# Patient Record
Sex: Female | Born: 2000 | Race: White | Hispanic: No | Marital: Single | Smoking: Never smoker
Health system: Southern US, Community
[De-identification: ages and names within clinical notes are randomized; demographics above are authoritative.]

## PROBLEM LIST (undated history)

## (undated) DIAGNOSIS — Z5189 Encounter for other specified aftercare: Secondary | ICD-10-CM

## (undated) DIAGNOSIS — G43909 Migraine, unspecified, not intractable, without status migrainosus: Secondary | ICD-10-CM

## (undated) DIAGNOSIS — F4310 Post-traumatic stress disorder, unspecified: Secondary | ICD-10-CM

## (undated) DIAGNOSIS — R000 Tachycardia, unspecified: Secondary | ICD-10-CM

## (undated) DIAGNOSIS — F419 Anxiety disorder, unspecified: Secondary | ICD-10-CM

## (undated) DIAGNOSIS — F32A Depression, unspecified: Secondary | ICD-10-CM

## (undated) DIAGNOSIS — F410 Panic disorder [episodic paroxysmal anxiety] without agoraphobia: Secondary | ICD-10-CM

## (undated) HISTORY — DX: Anxiety disorder, unspecified: F41.9

## (undated) HISTORY — DX: Tachycardia, unspecified: R00.0

## (undated) HISTORY — DX: Post-traumatic stress disorder, unspecified: F43.10

## (undated) HISTORY — PX: NO PAST SURGERIES: SHX2092

## (undated) HISTORY — DX: Depression, unspecified: F32.A

---
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
## 2020-01-08 ENCOUNTER — Other Ambulatory Visit (INDEPENDENT_AMBULATORY_CARE_PROVIDER_SITE_OTHER): Payer: Self-pay | Admitting: Pediatrics

## 2020-01-09 ENCOUNTER — Other Ambulatory Visit: Payer: Self-pay

## 2020-01-09 MED ORDER — DULOXETINE HCL 20 MG PO CPEP: 20.0000 mg | ORAL_CAPSULE | Freq: Every day | ORAL | 3 refills | Status: DC

## 2020-01-12 ENCOUNTER — Ambulatory Visit (INDEPENDENT_AMBULATORY_CARE_PROVIDER_SITE_OTHER): Payer: 59 | Admitting: Cardiology

## 2020-01-12 ENCOUNTER — Encounter: Payer: Self-pay | Admitting: Cardiology

## 2020-01-12 ENCOUNTER — Other Ambulatory Visit: Payer: Self-pay

## 2020-01-12 VITALS — BP 110/78 | HR 88 | Ht 61.01 in | Wt 97.0 lb

## 2020-01-12 DIAGNOSIS — R002 Palpitations: Secondary | ICD-10-CM

## 2020-01-12 DIAGNOSIS — I471 Supraventricular tachycardia: Secondary | ICD-10-CM

## 2020-01-12 NOTE — Patient Instructions (Signed)
Medication Instructions:  No medication changes *If you need a refill on your cardiac medications before your next appointment, please call your pharmacy*   Lab Work: Your physician recommends that you have a BMET and TSH drawn today.  If you have labs (blood work) drawn today and your tests are completely normal, you will receive your results only by: Marland Kitchen MyChart Message (if you have MyChart) OR . A paper copy in the mail If you have any lab test that is abnormal or we need to change your treatment, we will call you to review the results.   Testing/Procedures: None ordered   Follow-Up: At [facility], you and your health needs are our priority.  As part of our continuing mission to provide you with exceptional heart care, we have created designated Provider Care Teams.  These Care Teams include your primary Cardiologist (physician) and Advanced Practice Providers (APPs -  Physician Assistants and Nurse Practitioners) who all work together to provide you with the care you need, when you need it.  We recommend signing up for the patient portal called "MyChart".  Sign up information is provided on this After Visit Summary.  MyChart is used to connect with patients for Virtual Visits (Telemedicine).  Patients are able to view lab/test results, encounter notes, upcoming appointments, etc.  Non-urgent messages can be sent to your provider as well.   To learn more about what you can do with MyChart, go to [email or web address].    Your next appointment:   6 month(s)  The format for your next appointment:   In Person  Provider:   Belva Crome, MD   Other Instructions NA

## 2020-01-12 NOTE — Addendum Note (Signed)
Addended by: Eleonore Chiquito on: 01/12/2020 10:47 AM   Modules accepted: Orders

## 2020-01-12 NOTE — Progress Notes (Signed)
Cardiology Office Note:    Date:  01/12/2020   ID:  Deborah Mcdaniel, DOB July 01, 2001, MRN [id number]  PCP:  Deborah Ponto, MD  Cardiologist:  Deborah Brothers, MD   Referring MD: Deborah Ponto, MD    ASSESSMENT:    1. Paroxysmal SVT (supraventricular tachycardia) (HCC)   2. Palpitations    PLAN:    In order of problems listed above:  1. Primary prevention stressed with the patient.  Importance of compliance with diet and medication stressed her effort tolerance is excellent. 2. Palpitations: Supraventricular tachycardia paroxysms: Her palpitations have increased in frequency.  She is not keen on being on medications for a long time.  I had offered her the option of ablation in the past but she had deferred it because of her college.  Now she is back from college and is to be evaluated for this.  We will do a Chem-7 and a TSH today and refer her to our partner Deborah Mcdaniel for the needful. 3. Patient will be seen in follow-up appointment in 6 months or earlier if the patient has any concerns    Medication Adjustments/Labs and Tests Ordered: Current medicines are reviewed at length with the patient today.  Concerns regarding medicines are outlined above.  No orders of the defined types were placed in this encounter.  No orders of the defined types were placed in this encounter.    Chief Complaint  Patient presents with  . Follow-up     History of Present Illness:    Deborah Mcdaniel is a 19 y.o. female.  Patient has past medical history of supraventricular tachycardia and palpitations.  She is taking flecainide and tolerating it well.  She mentions to me that she is back from college now and is at home.  In the past 2 to 3 weeks her palpitations have gotten a little worse.  She walks about 45 minutes a day on a very regular basis and is taking very good care of herself.  No history of syncope or any dizziness.  At the time of my evaluation, the patient is alert awake oriented and in  no distress.  She now wants to be evaluated for ablation especially because it is her vacation time.  Past Medical History:  Diagnosis Date  . Cardiac murmur 07/29/2019  . Chest pain of uncertain etiology 10/06/2019  . Chronic tension-type headache, not intractable 09/03/2017  . Migraines   . Muscle cramp 11/12/2017  . Palpitations 07/29/2019  . Panic attacks   . Paroxysmal SVT (supraventricular tachycardia) (HCC) 09/08/2019  . Sciatica of left side 09/03/2017  . Sciatica of right side 11/12/2017  . Tachycardia   . Transient alteration of awareness 11/08/2017    Past Surgical History:  Procedure Laterality Date  . NO PAST SURGERIES      Current Medications: Current Meds  Medication Sig  . busPIRone (BUSPAR) 15 MG tablet Take 15 mg by mouth daily.   . DULoxetine (CYMBALTA) 20 MG capsule Take 1 capsule (20 mg total) by mouth daily.  . flecainide (TAMBOCOR) 50 MG tablet Take 1 tablet (50 mg total) by mouth 2 (two) times daily.  Marland Kitchen guanFACINE (TENEX) 1 MG tablet Take 1 mg by mouth at bedtime.  . mirtazapine (REMERON) 30 MG tablet Take 30 mg by mouth at bedtime.   . Multiple Vitamin (MULTIVITAMIN) tablet Take 1 tablet by mouth daily. Patient takes vit d and potassium  . SUMAtriptan (IMITREX) 20 MG/ACT nasal spray Place 1 spray (20 mg  total) into the nose as needed for migraine or headache (take once at onset of headache.  May repeat in 2 hours x1.).     Allergies:   Patient has no known allergies.   Social History   Socioeconomic History  . Marital status: Single    Spouse name: Not on file  . Number of children: Not on file  . Years of education: Not on file  . Highest education level: Not on file  Occupational History  . Not on file  Tobacco Use  . Smoking status: Never Smoker  . Smokeless tobacco: Never Used  Substance and Sexual Activity  . Alcohol use: No  . Drug use: No  . Sexual activity: Not on file  Other Topics Concern  . 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 She lives with her mother. 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       Counseling at [employer]- once a month   Social Determinants of Health   Financial Resource Strain:   . Difficulty of Paying Living Expenses:   Food Insecurity:   . Worried About Charity fundraiser in the Last Year:   . Arboriculturist in the Last Year:   Transportation Needs:   . Film/video editor (Medical):   Marland Kitchen Lack of Transportation (Non-Medical):   Physical Activity:   . Days of Exercise per Week:   . Minutes of Exercise per Session:   Stress:   . Feeling of Stress :   Social Connections:   . Frequency of Communication with Friends and Family:   . Frequency of Social Gatherings with Friends and Family:   . Attends Religious Services:   . Active Member of Clubs or Organizations:   . Attends Archivist Meetings:   Marland Kitchen Marital Status:      Family History: The patient's family history includes ADD / ADHD in her cousin and mother; Anxiety disorder in her father and mother; Depression in her father and mother; Migraines in her mother. There is no history of Seizures, Bipolar disorder, Schizophrenia, or Autism.  ROS:   Please see the history of present illness.    All other systems reviewed and are negative.  EKGs/Labs/Other Studies Reviewed:    The following studies were reviewed today: I discussed my findings with the patient at length   Recent Labs: No results found for requested labs within last 8760 hours.  Recent Lipid Panel No results found for: CHOL, TRIG, HDL, CHOLHDL, VLDL, LDLCALC, LDLDIRECT  Physical Exam:    VS:  BP 110/78 (BP Location: Left Arm, Patient Position: Sitting, Cuff Size: Normal)   Pulse 88   Ht 5' 1.01" (1.55 m)   Wt 97 lb (44 kg)   SpO2 99%   BMI 18.32 kg/m     Wt Readings from Last 3 Encounters:  01/12/20 97 lb (44 kg) (2 %, Z= -2.05)*    09/23/19 93 lb (42.2 kg) (<1 %, Z= -2.45)*  09/12/19 92 lb 9.6 oz (42 kg) (<1 %, Z= -2.49)*   * Growth percentiles are based on CDC (Girls, 2-20 Years) data.     GEN: Patient is in no acute distress HEENT: Normal NECK: No JVD; No carotid bruits LYMPHATICS: No lymphadenopathy CARDIAC: Hear sounds regular, 2/6 systolic murmur at the apex. RESPIRATORY:  Clear to auscultation without rales, wheezing  or rhonchi  ABDOMEN: Soft, non-tender, non-distended MUSCULOSKELETAL:  No edema; No deformity  SKIN: Warm and dry NEUROLOGIC:  Alert and oriented x 3 PSYCHIATRIC:  Normal affect   Signed, Deborah Brothers, MD  01/12/2020 10:30 AM    [facility]

## 2020-01-13 ENCOUNTER — Telehealth: Payer: Self-pay

## 2020-01-13 LAB — BASIC METABOLIC PANEL
BUN/Creatinine Ratio: 12 (ref 9–23)
BUN: 11 mg/dL (ref 6–20)
CO2: 25 mmol/L (ref 20–29)
Calcium: 9.5 mg/dL (ref 8.7–10.2)
Chloride: 100 mmol/L (ref 96–106)
Creatinine, Ser: 0.89 mg/dL (ref 0.57–1.00)
GFR calc Af Amer: 109 mL/min/{1.73_m2} (ref >59)
GFR calc non Af Amer: 94 mL/min/{1.73_m2} (ref >59)
Glucose: 60 mg/dL — ABNORMAL LOW (ref 65–99)
Potassium: 4.3 mmol/L (ref 3.5–5.2)
Sodium: 140 mmol/L (ref 134–144)

## 2020-01-13 LAB — TSH: TSH: 1.1 u[IU]/mL (ref 0.450–4.500)

## 2020-01-13 NOTE — Telephone Encounter (Signed)
-----   Message from Garwin Brothers, MD sent at 01/13/2020  8:10 AM EDT ----- The results of the study is unremarkable. Please inform patient. I will discuss in detail at next appointment. Cc  primary care/referring physician Garwin Brothers, MD 01/13/2020 8:10 AM

## 2020-01-13 NOTE — Telephone Encounter (Signed)
Spoke with patient regarding results and recommendation.  Patient verbalizes understanding and is agreeable to plan of care. Advised patient to call back with any issues or concerns.  

## 2020-01-13 NOTE — Telephone Encounter (Signed)
Left message on patients voicemail to please return our call.   

## 2020-01-13 NOTE — Telephone Encounter (Signed)
Follow up ° ° °Patient is returning call for lab results. Please call. °

## 2020-01-13 NOTE — Telephone Encounter (Signed)
-----   Message from Rajan R Revankar, MD sent at 01/13/2020  8:10 AM EDT ----- The results of the study is unremarkable. Please inform patient. I will discuss in detail at next appointment. Cc  primary care/referring physician Rajan R Revankar, MD 01/13/2020 8:10 AM  

## 2020-02-04 ENCOUNTER — Ambulatory Visit (INDEPENDENT_AMBULATORY_CARE_PROVIDER_SITE_OTHER): Payer: 59 | Admitting: Internal Medicine

## 2020-02-04 ENCOUNTER — Encounter: Payer: Self-pay | Admitting: Internal Medicine

## 2020-02-04 ENCOUNTER — Other Ambulatory Visit: Payer: Self-pay

## 2020-02-04 VITALS — BP 102/64 | HR 91 | Ht 61.0 in | Wt 94.8 lb

## 2020-02-04 DIAGNOSIS — R002 Palpitations: Secondary | ICD-10-CM

## 2020-02-04 DIAGNOSIS — R000 Tachycardia, unspecified: Secondary | ICD-10-CM

## 2020-02-04 NOTE — Patient Instructions (Addendum)
Medication Instructions:  Your physician recommends that you continue on your current medications as directed. Please refer to the Current Medication list given to you today.  Labwork: None ordered.  Testing/Procedures: None ordered.  Follow-Up: Your physician wants you to follow-up in: December 2021 with Dr. Ladona Ridgel.     When you know what days you will be on break call:  Harrietta Guardian at [phone]   Any Other Special Instructions Will Be Listed Below (If Applicable).  If you need a refill on your cardiac medications before your next appointment, please call your pharmacy.

## 2020-02-04 NOTE — Progress Notes (Signed)
HPI Deborah Mcdaniel is referred today by Dr. Tomie China for evaluation of palpitations. She is a pleasant 19 yo woman with a lot of cardiac awareness. She has seen Dr. Josiah Lobo for palpitations on several occaisions and has been started first on metoprolol and then on flecainide. Her cardiac monitor showed NSR and sinus tachycardia but also some non-sustained junctional tachycardia. I do not see any clear cut SVT. She has not had syncope. At times it feels like her heart is racing, despite her not having other symptoms. She thinks that the flecainide has helped symptoms.  No Known Allergies   Current Outpatient Medications  Medication Sig Dispense Refill  . busPIRone (BUSPAR) 15 MG tablet Take 15 mg by mouth daily.     . DULoxetine (CYMBALTA) 20 MG capsule Take 1 capsule (20 mg total) by mouth daily. 30 capsule 3  . flecainide (TAMBOCOR) 50 MG tablet Take 1 tablet (50 mg total) by mouth 2 (two) times daily. 60 tablet 3  . guanFACINE (TENEX) 1 MG tablet Take 1 mg by mouth at bedtime.    . mirtazapine (REMERON) 30 MG tablet Take 30 mg by mouth at bedtime.     . Multiple Vitamin (MULTIVITAMIN) tablet Take 1 tablet by mouth daily. Patient takes vit d and potassium    . SUMAtriptan (IMITREX) 20 MG/ACT nasal spray Place 1 spray (20 mg total) into the nose as needed for migraine or headache (take once at onset of headache.  May repeat in 2 hours x1.). 12 Inhaler 5   No current facility-administered medications for this visit.     Past Medical History:  Diagnosis Date  . Cardiac murmur 07/29/2019  . Chest pain of uncertain etiology 10/06/2019  . Chronic tension-type headache, not intractable 09/03/2017  . Migraines   . Muscle cramp 11/12/2017  . Palpitations 07/29/2019  . Panic attacks   . Paroxysmal SVT (supraventricular tachycardia) (HCC) 09/08/2019  . Sciatica of left side 09/03/2017  . Sciatica of right side 11/12/2017  . Tachycardia   . Transient alteration of awareness 11/08/2017    ROS:   All systems reviewed and negative except as noted in the HPI.   Past Surgical History:  Procedure Laterality Date  . NO PAST SURGERIES       Family History  Problem Relation Age of Onset  . Migraines Mother   . Depression Mother   . Anxiety disorder Mother   . ADD / ADHD Mother   . Depression Father   . Anxiety disorder Father   . ADD / ADHD Cousin   . Seizures Neg Hx   . Bipolar disorder Neg Hx   . Schizophrenia Neg Hx   . Autism Neg Hx      Social History   Socioeconomic History  . Marital status: Single    Spouse name: Not on file  . Number of children: Not on file  . Years of education: Not on file  . Highest education level: Not on file  Occupational History  . Not on file  Tobacco Use  . Smoking status: Never Smoker  . Smokeless tobacco: Never Used  Substance and Sexual Activity  . Alcohol use: No  . Drug use: No  . Sexual activity: Not on file  Other Topics Concern  . 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 She lives with her mother. 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       Counseling at [employer]- once a month   Social Determinants of Health   Financial Resource Strain:   . Difficulty of Paying Living Expenses:   Food Insecurity:   . Worried About Charity fundraiser in the Last Year:   . Arboriculturist in the Last Year:   Transportation Needs:   . Film/video editor (Medical):   Marland Kitchen Lack of Transportation (Non-Medical):   Physical Activity:   . Days of Exercise per Week:   . Minutes of Exercise per Session:   Stress:   . Feeling of Stress :   Social Connections:   . Frequency of Communication with Friends and Family:   . Frequency of Social Gatherings with Friends and Family:   . Attends Religious Services:   . Active Member of Clubs or Organizations:   . Attends Archivist Meetings:   Marland Kitchen Marital Status:     Intimate Partner Violence:   . Fear of Current or Ex-Partner:   . Emotionally Abused:   Marland Kitchen Physically Abused:   . Sexually Abused:      BP 102/64   Pulse 91   Ht 5\' 1"  (1.549 m)   Wt 94 lb 12.8 oz (43 kg)   SpO2 100%   BMI 17.91 kg/m   Physical Exam:  Well appearing NAD HEENT: Unremarkable Neck:  No JVD, no thyromegally Lymphatics:  No adenopathy Back:  No CVA tenderness Lungs:  Clear with no wheezes HEART:  Regular rate rhythm, no murmurs, no rubs, no clicks Abd:  soft, positive bowel sounds, no organomegally, no rebound, no guarding Ext:  2 plus pulses, no edema, no cyanosis, no clubbing Skin:  No rashes no nodules Neuro:  CN II through XII intact, motor grossly intact  EKG - NSR  Assess/Plan: 1. Palpitations - the etiology appears to be sinus tachycardia. I do not see any clear evidence of SVT. However, she clearly has experienced an improvement on the low dose flecainide so I will allow her to continue this for now.  2. Probable autonomic dysfunction - I think that much of her symptoms are related to autonomic dysfunction. I have asked her to increase her salt and fluid intake, not miss meals, and increase her level of exercise. I'll see her back in 12/21.   Mikle Bosworth.D.

## 2020-02-09 ENCOUNTER — Other Ambulatory Visit: Payer: Self-pay | Admitting: Cardiology

## 2020-02-11 ENCOUNTER — Encounter (INDEPENDENT_AMBULATORY_CARE_PROVIDER_SITE_OTHER): Payer: Self-pay | Admitting: Pediatrics

## 2020-02-11 ENCOUNTER — Telehealth (INDEPENDENT_AMBULATORY_CARE_PROVIDER_SITE_OTHER): Payer: Self-pay | Admitting: Pediatrics

## 2020-02-11 NOTE — Telephone Encounter (Signed)
Left a message to call back and schedule follow up. A letter was also sent to do this

## 2020-03-01 ENCOUNTER — Telehealth (INDEPENDENT_AMBULATORY_CARE_PROVIDER_SITE_OTHER): Payer: Self-pay | Admitting: Pediatrics

## 2020-03-01 NOTE — Telephone Encounter (Signed)
Please schedule this pt to See Dr Artis Flock in the next couple weeks.  Thanks

## 2020-03-01 NOTE — Telephone Encounter (Signed)
Who's calling (name and relationship to patient) :Deborah Mcdaniel ( Self)   Best contact number:[phone]  Provider they see:Dr. Artis Flock  Reason for call:Caler States she is having shooting pain at the top of head Spoke with on call Provider dr. Devonne Doughty at 7:56 pm 02-27-20  Call ID: [phone]     PRESCRIPTION REFILL ONLY  Name of prescription:  Pharmacy:

## 2020-03-03 NOTE — Telephone Encounter (Signed)
Patient has been scheduled to come in to see Dr. Artis Flock on 03-29-20

## 2020-03-09 NOTE — Progress Notes (Signed)
[facility]    Provider:  Dr Lucia Gaskins Requesting Provider: Marylen Ponto, MD Primary Care Provider:  Marylen Ponto, MD  CC:  Pinched nerve  HPI:  Deborah Mcdaniel is a 19 y.o. female here as requested by Marylen Ponto, MD for "pinched nerve".  She has a past medical history of sciatica, chronic tension type headache, anxiety, asthma, depression, migraine, PTSD.Patient was recently seen in the emergency room February 28, 2020 with complaints of headache onset 2 to 3 days prior, located at the very top of her head but radiating down the entire right side of her body to her toes.  This is intermittent pain, mild and achy at times but then she will have stabbing pain that radiates all the way down to her toes.  The pain is 3 out of 10 and can be 10 out of 10 at its worst.  She also reported a history of migraines, tried taking Imitrex but it did not work, reported associated nausea.  She also reported she lost consciousness for less than a minute and fell to the floor.  She denied vomiting, visual disturbances, photophobia, weakness, numbness, tingling, chest pain, shortness of breath, seizure, palpitations or other associated symptoms.  Examination was normal and she was in no acute distress.  Labs included a BMP with BUN of 11 and creatinine 0.76 which is normal.  CT of the head showed no acute intracranial abnormality, normal brain (reviewed report).  Potassium was slightly low, she was reassured and she felt better, she was given a migraine cocktail, and discharged.  She reports today she has a "pinched nerve". She was in the ED for migraines recently. She has shooting pain down the right side from the neck to the fingers, and goes down the leg the same thing that happened din the ED in the setting of a headache. It was her face, the arm and the leg in a setting of a headache. She has had sciatica in the past and this was different. She has had migraine since she was little, she went to the  ED and had stabbing pain on top of the head, migraines are usually in the back of the head or in the temples, she gets nauseated and pulsating and pounding throbbing, light and sound sensitivity, she has a family history of migraines, face was involved that day. She has had the shooting pain in the arm and the leg the arm goes numb. Both hands usually go numb together not always with the leg, the leg shooting pain is more like her sciatica. She gets headaches once a week, she takes imitrex and ibuprofen. Sleeping helps. Here with boyfriend who also provides information. She has palpitations and has seen crdiology. No other focal neurologic deficits, associated symptoms, inciting events or modifiable factors.  Reviewed notes, labs and imaging from outside physicians, which showed:  CT head (reviewed report) normal 02/28/2020:  Brain: No evidence of acute infarction, hemorrhage, hydrocephalus,  extra-axial collection or mass lesion/mass effect.   Vascular: No hyperdense vessel or unexpected calcification.   Skull: Normal. Negative for fracture or focal lesion.   Sinuses/Orbits: No acute finding.   Other: None.   IMPRESSION:  1. No acute intracranial abnormalities. The appearance of the brain is normal.   02/28/2020: urine drug screen neg, ethanol negative, bmo with K 3., BUN 11, Creat 0.76 otherwise normal, cbc normal, lfts normal,  reviewd images XR c-spine 2019 and agree with the following: EXAM: CERVICAL SPINE - COMPLETE 4+  VIEW  COMPARISON:  None.  FINDINGS: There is no evidence of cervical spine fracture or prevertebral soft tissue swelling. Alignment is normal. No other significant bone abnormalities are identified.  IMPRESSION: Negative cervical spine radiographs.  Review of Systems: Patient complains of symptoms per HPI as well as the following symptoms: headaches, numbness, palpitations, scitica. Pertinent negatives and positives per HPI. All others negative.   Social  History   Socioeconomic History  . Marital status: Single    Spouse name: Not on file  . Number of children: Not on file  . Years of education: Not on file  . Highest education level: Not on file  Occupational History  . Not on file  Tobacco Use  . Smoking status: Never Smoker  . Smokeless tobacco: Never Used  Vaping Use  . Vaping Use: Never used  Substance and Sexual Activity  . Alcohol use: No  . Drug use: No  . Sexual activity: Not on file  Other Topics Concern  . Not on file  Social History Narrative   Deborah Mcdaniel is a sophomore at [employer]; she does well in school but misses a lot of school due to pain. She lives with her mother. 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       [facility]sel[facility] at [facility]- once a month (sees a nurse but doesn't do counseling as of 03/10/2020)      Right handed      Caffeine: none    Social Determinants of Health   Financial Resource Strain:   . Difficulty of Paying Living Expenses:   Food Insecurity:   . Worried About Programme researcher, broadcasting/film/videounning Out of Food in the Last Year:   . Baristaan Out of Food in the Last Year:   Transportation Needs:   . Freight forwarderLack of Transportation (Medical):   Marland Kitchen. Lack of Transportation (Non-Medical):   Physical Activity:   . Days of Exercise per Week:   . Minutes of Exercise per Session:   Stress:   . Feeling of Stress :   Social Connections:   . Frequency of Communication with Friends and Family:   . Frequency of Social Gatherings with Friends and Family:   . Attends Religious Services:   . Active Member of Clubs or Organizations:   . Attends BankerClub or Organization Meetings:   Marland Kitchen. Marital Status:   Intimate Partner Violence:   . Fear of Current or Ex-Partner:   . Emotionally Abused:   Marland Kitchen. Physically Abused:   . Sexually Abused:     Family History  Problem Relation Age of Onset  . Migraines Mother   . Depression Mother   . Anxiety disorder Mother   . ADD / ADHD Mother   . Fibromyalgia Mother   . Other Mother         myelomalacia  . Depression Father   . Anxiety disorder Father   . ADD / ADHD Cousin   . Seizures Neg Hx   . Bipolar disorder Neg Hx   . Schizophrenia Neg Hx   . Autism Neg Hx     Past Medical History:  Diagnosis Date  . Anxiety   . Cardiac murmur 07/29/2019  . Chest pain of uncertain etiology 10/06/2019  . Chronic tension-type headache, not intractable 09/03/2017  . Depression   . Migraines   . Muscle cramp 11/12/2017  . Palpitations 07/29/2019  . Panic attacks   . Paroxysmal SVT (supraventricular tachycardia) (HCC) 09/08/2019  . PTSD (post-traumatic stress disorder)   . Sciatica of left side  09/03/2017  . Sciatica of right side 11/12/2017  . Tachycardia   . Transient alteration of awareness 11/08/2017    Patient Active Problem List   Diagnosis Date Noted  . Bilateral hand numbness 03/10/2020  . Nonintractable episodic headache 03/10/2020  . Migraine without aura and without status migrainosus, not intractable 03/10/2020  . Tachycardia 02/04/2020  . Chest pain of uncertain etiology 10/06/2019  . Paroxysmal SVT (supraventricular tachycardia) (HCC) 09/08/2019  . Palpitations 07/29/2019  . Cardiac murmur 07/29/2019  . Sciatica of right side 11/12/2017  . Muscle cramp 11/12/2017  . Transient alteration of awareness 11/08/2017  . Sciatica of left side 09/03/2017  . Chronic tension-type headache, not intractable 09/03/2017    Past Surgical History:  Procedure Laterality Date  . NO PAST SURGERIES      Current Outpatient Medications  Medication Sig Dispense Refill  . busPIRone (BUSPAR) 15 MG tablet Take 15 mg by mouth daily.     Marland Kitchen guanFACINE (TENEX) 1 MG tablet Take 1 mg by mouth at bedtime.    . mirtazapine (REMERON) 30 MG tablet Take 30 mg by mouth at bedtime.     . Multiple Vitamin (MULTIVITAMIN) tablet Take 1 tablet by mouth daily. Patient takes vit d and potassium    . DULoxetine (CYMBALTA) 30 MG capsule Take 1 capsule (30 mg total) by mouth daily. 90 capsule 3  .  rizatriptan (MAXALT-MLT) 10 MG disintegrating tablet Take 1 tablet (10 mg total) by mouth as needed for migraine. May repeat in 2 hours if needed 9 tablet 11   No current facility-administered medications for this visit.    Allergies as of 03/10/2020  . (No Known Allergies)    Vitals: BP 111/72 (BP Location: Left Arm, Patient Position: Sitting)   Pulse 70   Ht  (1.549 m)   Wt 96 lb (43.5 kg)   BMI 18.14 kg/m  Last Weight:  Wt Readings from Last 1 Encounters:  03/10/20 96 lb (43.5 kg) (2 %, Z= -2.16)*   * Growth percentiles are based on CDC (Girls, 2-20 Years) data.   Last Height:   Ht Readings from Last 1 Encounters:  03/10/20  (1.549 m) (10 %, Z= -1.28)*   * Growth percentiles are based on CDC (Girls, 2-20 Years) data.     Physical exam: Exam: Gen: NAD, conversant, well nourised, well groomed                     CV: RRR, no MRG. No Carotid Bruits. No peripheral edema, warm, nontender Eyes: Conjunctivae clear without exudates or hemorrhage  Neuro: Detailed Neurologic Exam  Speech:    Speech is normal; fluent and spontaneous with normal comprehension.  Cognition:    The patient is oriented to person, place, and time;     recent and remote memory intact;     language fluent;     normal attention, concentration,     fund of knowledge Cranial Nerves:    The pupils are equal, round, and reactive to light. The fundi are normal and spontaneous venous pulsations are present. Visual fields are full to finger confrontation. Extraocular movements are intact. Trigeminal sensation is intact and the muscles of mastication are normal. The face is symmetric. The palate elevates in the midline. Hearing intact. Voice is normal. Shoulder shrug is normal. The tongue has normal motion without fasciculations.   Coordination:    No dysmetria or ataxia   Gait:    Normal native gait  Motor Observation:  No asymmetry, no atrophy, and no involuntary movements noted. Tone:     Normal muscle tone.    Posture:    Posture is normal. normal erect    Strength:    Strength is V/V in the upper and lower limbs.      Sensation: intact to LT     Reflex Exam:  DTR's:    Deep tendon reflexes in the upper and lower extremities brisk but within normal limits for age bilaterally.   Toes:    The toes are downgoing bilaterally.   Clonus:    Clonus is absent.    Assessment/Plan:  Patient with multiple complaints appears to have episodic migraines, possibly CTS and sciatica. She had an episode of right-sided symptoms with a migraine which may have been migraine aura but unclear, she has multiple symptoms and complaints, But given right-sided hemisensory changes (face, arm, head) with pain "running down my spine" will recommend MRI brain and c-spine as below.   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 - Right-sided numbness with the headache, may be Migraine aura, may be associated with the sciatica and/or CTS but may recommend MRI of the brain and cervical spine to rule out other etiologies such as MS in a young woman Sciatica: She did not like Gabapentin, can increase her Cymbalta which may also help with migraines/headaches. She will be going to [place], transfer of care to neuro there per pcp  MRI of the brain and cervical spine:  Stop Imitrex start Rizatriptan: Please take one tablet at the onset of your headache. If it does not improve the symptoms please take one additional tablet. Do not take more then 2 tablets in 24hrs. Do not take use more then 2 to 3 times in a week. Increase Cymbalta to 30mg  daily (from 20mg ) Please ask pcp for referral to neurology in [facility] Physical therapy in the future for sciatica  To  prevent or relieve headaches, try the following: Cool Compress. Lie down and place a cool compress on your head.  Avoid headache triggers. If certain foods or odors seem to have triggered your migraines in the past, avoid them. A headache diary might help you identify triggers.  Include physical activity in your daily routine. Try a daily walk or other moderate aerobic exercise.  Manage stress. Find healthy ways to cope with the stressors, such as delegating tasks on your to-do list.  Practice relaxation techniques. Try deep breathing, yoga, massage and visualization.  Eat regularly. Eating regularly scheduled meals and maintaining a healthy diet might help prevent headaches. Also, drink plenty of fluids.  Follow a regular sleep schedule. Sleep deprivation might contribute to headaches Consider biofeedback. With this mind-body technique, you learn to control certain bodily functions -- such as muscle tension, heart rate and blood pressure -- to prevent headaches or reduce headache pain.    Proceed to emergency room if you experience new or worsening symptoms or symptoms do not resolve, if you have new neurologic symptoms or if headache is severe, or for any concerning symptom.   Provided education and documentation from American headache Society toolbox including articles on: chronic migraine medication overuse headache, chronic migraines, prevention of migraines, behavioral and other nonpharmacologic treatments for headache.  Discussed: There is  increased risk for stroke in women with migraine with aura and a contraindication for the combined contraceptive pill for use by women who have migraine with aura. The risk for women with migraine without aura is lower. However other risk factors like smoking are far more likely to increase stroke risk than migraine. There is a recommendation for no smoking and for the use of OCPs without estrogen such as progestogen only pills particularly for women with  migraine with aura.Marland Kitchen People who have migraine headaches with auras may be 3 times more likely to have a stroke caused by a blood clot, compared to migraine patients who don't see auras. Women who take hormone-replacement therapy may be 30 percent more likely to suffer a clot-based stroke than women not taking medication containing estrogen. Other risk factors like smoking and high blood pressure may be  much more important.  No orders of the defined types were placed in this encounter.  Meds ordered this encounter  Medications  . rizatriptan (MAXALT-MLT) 10 MG disintegrating tablet    Sig: Take 1 tablet (10 mg total) by mouth as needed for migraine. May repeat in 2 hours if needed    Dispense:  9 tablet    Refill:  11  . DULoxetine (CYMBALTA) 30 MG capsule    Sig: Take 1 capsule (30 mg total) by mouth daily.    Dispense:  90 capsule    Refill:  3    Cc: Marylen Ponto, MD,  Marylen Ponto, MD  Naomie Dean, MD  [facility] [address] [place], [place] [address]  Phone [phone] Fax [phone]

## 2020-03-10 ENCOUNTER — Ambulatory Visit (INDEPENDENT_AMBULATORY_CARE_PROVIDER_SITE_OTHER): Payer: 59 | Admitting: Neurology

## 2020-03-10 ENCOUNTER — Encounter: Payer: Self-pay | Admitting: Neurology

## 2020-03-10 VITALS — BP 111/72 | HR 70 | Ht 61.0 in | Wt 96.0 lb

## 2020-03-10 DIAGNOSIS — R519 Headache, unspecified: Secondary | ICD-10-CM | POA: Diagnosis not present

## 2020-03-10 DIAGNOSIS — G43009 Migraine without aura, not intractable, without status migrainosus: Secondary | ICD-10-CM

## 2020-03-10 DIAGNOSIS — R292 Abnormal reflex: Secondary | ICD-10-CM

## 2020-03-10 DIAGNOSIS — R29818 Other symptoms and signs involving the nervous system: Secondary | ICD-10-CM

## 2020-03-10 DIAGNOSIS — M5431 Sciatica, right side: Secondary | ICD-10-CM

## 2020-03-10 DIAGNOSIS — R202 Paresthesia of skin: Secondary | ICD-10-CM

## 2020-03-10 DIAGNOSIS — R200 Anesthesia of skin: Secondary | ICD-10-CM

## 2020-03-10 MED ORDER — DULOXETINE HCL 30 MG PO CPEP: 30.0000 mg | ORAL_CAPSULE | Freq: Every day | ORAL | 3 refills | Status: AC

## 2020-03-10 MED ORDER — RIZATRIPTAN BENZOATE 10 MG PO TBDP: 10.0000 mg | ORAL_TABLET | ORAL | 11 refills | Status: AC | PRN

## 2020-03-10 NOTE — Patient Instructions (Addendum)
MRI of the brain and cervical spine:  Stop Imitrex start Rizatriptan: Please take one tablet at the onset of your headache. If it does not improve the symptoms please take one additional tablet. Do not take more then 2 tablets in 24hrs. Do not take use more then 2 to 3 times in a week. Increase Cymbalta to 30mg  daily (from 20mg ) Please [facility] Carpal Tunnel: Try wrist splints at work Suggest Physical therapy in the future for sciatica  There is increased risk for stroke in women with migraine with aura and a contraindication for the combined contraceptive pill for use by women who have migraine with aura. The risk for women with migraine without aura is lower. However other risk factors like smoking are far more likely to increase stroke risk than migraine. There is a recommendation for no smoking and for the use of OCPs without estrogen such as progestogen only pills particularly for women with migraine with aura.Marland Kitchen. People who have migraine headaches with auras may be 3 times more likely to have a stroke caused by a blood clot, compared to migraine patients who don't see auras. Women who take hormone-replacement therapy may be 30 percent more likely to suffer a clot-based stroke than women not taking medication containing estrogen. Other risk factors like smoking and high blood pressure may be  much more important.  Duloxetine delayed-release capsules What is this medicine? DULOXETINE (doo LOX e teen) is used to treat depression, anxiety, and different types of chronic pain. This medicine may be used for other purposes; ask your health care provider or pharmacist if you have questions. COMMON BRAND NAME(S): Cymbalta, Murrell Converserizalma, Irenka What should I tell my health care provider before I take this medicine? They need to know if you have any of these conditions:  bipolar disorder  glaucoma  high blood pressure  kidney disease  liver  disease  seizures  suicidal thoughts, plans or attempt; a previous suicide attempt by you or a family member  take medicines that treat or prevent blood clots  taken medicines called MAOIs like Carbex, Eldepryl, Marplan, Nardil, and Parnate within 14 days  trouble passing urine  an unusual reaction to duloxetine, other medicines, foods, dyes, or preservatives  pregnant or trying to get pregnant  breast-feeding How should I use this medicine? Take this medicine by mouth with a glass of water. Follow the directions on the prescription label. Do not crush, cut or chew some capsules of this medicine. Some capsules may be opened and sprinkled on applesauce. Check with your doctor or pharmacist if you are not sure. You can take this medicine with or without food. Take your medicine at regular intervals. Do not take your medicine more often than directed. Do not stop taking this medicine suddenly except upon the advice of your doctor. Stopping this medicine too quickly may cause serious side effects or your condition may worsen. A special MedGuide will be given to you by the pharmacist with each prescription and refill. Be sure to read this information carefully each time. Talk to your pediatrician regarding the use of this medicine in children. While this drug may be prescribed for children as young as 637 years of age for selected conditions, precautions do apply. Overdosage: If you think you have taken too much of this medicine contact a poison control center or emergency room at once. NOTE: This medicine is only for you. Do not share this medicine with others. What if I miss  a dose? If you miss a dose, take it as soon as you can. If it is almost time for your next dose, take only that dose. Do not take double or extra doses. What may interact with this medicine? Do not take this medicine with any of the following medications:  desvenlafaxine  levomilnacipran  linezolid  MAOIs like  Carbex, Eldepryl, Marplan, Nardil, and Parnate  methylene blue (injected into a vein)  milnacipran  thioridazine  venlafaxine This medicine may also interact with the following medications:  alcohol  amphetamines  aspirin and aspirin-like medicines  certain antibiotics like ciprofloxacin and enoxacin  certain medicines for blood pressure, heart disease, irregular heart beat  certain medicines for depression, anxiety, or psychotic disturbances  certain medicines for migraine headache like almotriptan, eletriptan, frovatriptan, naratriptan, rizatriptan, sumatriptan, zolmitriptan  certain medicines that treat or prevent blood clots like warfarin, enoxaparin, and dalteparin  cimetidine  fentanyl  lithium  NSAIDS, medicines for pain and inflammation, like ibuprofen or naproxen  phentermine  procarbazine  rasagiline  sibutramine  St. John's wort  theophylline  tramadol  tryptophan This list may not describe all possible interactions. Give your health care provider a list of all the medicines, herbs, non-prescription drugs, or dietary supplements you use. Also tell them if you smoke, drink alcohol, or use illegal drugs. Some items may interact with your medicine. What should I watch for while using this medicine? Tell your doctor if your symptoms do not get better or if they get worse. Visit your doctor or healthcare provider for regular checks on your progress. Because it may take several weeks to see the full effects of this medicine, it is important to continue your treatment as prescribed by your doctor. This medicine may cause serious skin reactions. They can happen weeks to months after starting the medicine. Contact your healthcare provider right away if you notice fevers or flu-like symptoms with a rash. The rash may be red or purple and then turn into blisters or peeling of the skin. Or, you might notice a red rash with swelling of the face, lips, or lymph nodes  in your neck or under your arms. Patients and their families should watch out for new or worsening thoughts of suicide or depression. Also watch out for sudden changes in feelings such as feeling anxious, agitated, panicky, irritable, hostile, aggressive, impulsive, severely restless, overly excited and hyperactive, or not being able to sleep. If this happens, especially at the beginning of treatment or after a change in dose, call your healthcare provider. You may get drowsy or dizzy. Do not drive, use machinery, or do anything that needs mental alertness until you know how this medicine affects you. Do not stand or sit up quickly, especially if you are an older patient. This reduces the risk of dizzy or fainting spells. Alcohol may interfere with the effect of this medicine. Avoid alcoholic drinks. This medicine can cause an increase in blood pressure. This medicine can also cause a sudden drop in your blood pressure, which may make you feel faint and increase the chance of a fall. These effects are most common when you first start the medicine or when the dose is increased, or during use of other medicines that can cause a sudden drop in blood pressure. Check with your doctor for instructions on monitoring your blood pressure while taking this medicine. Your mouth may get dry. Chewing sugarless gum or sucking hard candy, and drinking plenty of water, may help. Contact your doctor if  the problem does not go away or is severe. What side effects may I notice from receiving this medicine? Side effects that you should report to your doctor or health care professional as soon as possible:  allergic reactions like skin rash, itching or hives, swelling of the face, lips, or tongue  anxious  breathing problems  confusion  changes in vision  chest pain  confusion  elevated mood, decreased need for sleep, racing thoughts, impulsive behavior  eye pain  fast, irregular heartbeat  feeling faint or  lightheaded, falls  feeling agitated, angry, or irritable  hallucination, loss of contact with reality  high blood pressure  loss of balance or coordination  palpitations  redness, blistering, peeling or loosening of the skin, including inside the mouth  restlessness, pacing, inability to keep still  seizures  stiff muscles  suicidal thoughts or other mood changes  trouble passing urine or change in the amount of urine  trouble sleeping  unusual bleeding or bruising  unusually weak or tired  vomiting  yellowing of the eyes or skin Side effects that usually do not require medical attention (report to your doctor or health care professional if they continue or are bothersome):  change in sex drive or performance  change in appetite or weight  constipation  dizziness  dry mouth  headache  increased sweating  nausea  tired This list may not describe all possible side effects. Call your doctor for medical advice about side effects. You may report side effects to FDA at 1-800-FDA-1088. Where should I keep my medicine? Keep out of the reach of children. Store at room temperature between 15 and 30 degrees C (59 to 86 degrees F). Throw away any unused medicine after the expiration date. NOTE: This sheet is a summary. It may not cover all possible information. If you have questions about this medicine, talk to your doctor, pharmacist, or health care provider.  2020 Elsevier/Gold Standard (2018-11-14 13:47:50)   Migraine Headache A migraine headache is an intense, throbbing pain on one side or both sides of the head. Migraine headaches may also cause other symptoms, such as nausea, vomiting, and sensitivity to light and noise. A migraine headache can last from 4 hours to 3 days. Talk with your doctor about what things may bring on (trigger) your migraine headaches. What are the causes? The exact cause of this condition is not known. However, a migraine may be  caused when nerves in the brain become irritated and release chemicals that cause inflammation of blood vessels. This inflammation causes pain. This condition may be triggered or caused by:  Drinking alcohol.  Smoking.  Taking medicines, such as: ? Medicine used to treat chest pain (nitroglycerin). ? Birth control pills. ? Estrogen. ? Certain blood pressure medicines.  Eating or drinking products that contain nitrates, glutamate, aspartame, or tyramine. Aged cheeses, chocolate, or caffeine may also be triggers.  Doing physical activity. Other things that may trigger a migraine headache include:  Menstruation.  Pregnancy.  Hunger.  Stress.  Lack of sleep or too much sleep.  Weather changes.  Fatigue. What increases the risk? The following factors may make you more likely to experience migraine headaches:  Being a certain age. This condition is more common in people who are 8-37 years old.  Being female.  Having a family history of migraine headaches.  Being Caucasian.  Having a mental health condition, such as depression or anxiety.  Being obese. What are the signs or symptoms? The main symptom of  this condition is pulsating or throbbing pain. This pain may:  Happen in any area of the head, such as on one side or both sides.  Interfere with daily activities.  Get worse with physical activity.  Get worse with exposure to bright lights or loud noises. Other symptoms may include:  Nausea.  Vomiting.  Dizziness.  General sensitivity to bright lights, loud noises, or smells. Before you get a migraine headache, you may get warning signs (an aura). An aura may include:  Seeing flashing lights or having blind spots.  Seeing bright spots, halos, or zigzag lines.  Having tunnel vision or blurred vision.  Having numbness or a tingling feeling.  Having trouble talking.  Having muscle weakness. Some people have symptoms after a migraine headache  (postdromal phase), such as:  Feeling tired.  Difficulty concentrating. How is this diagnosed? A migraine headache can be diagnosed based on:  Your symptoms.  A physical exam.  Tests, such as: ? CT scan or an MRI of the head. These imaging tests can help rule out other causes of headaches. ? Taking fluid from the spine (lumbar puncture) and analyzing it (cerebrospinal fluid analysis, or CSF analysis). How is this treated? This condition may be treated with medicines that:  Relieve pain.  Relieve nausea.  Prevent migraine headaches. Treatment for this condition may also include:  Acupuncture.  Lifestyle changes like avoiding foods that trigger migraine headaches.  Biofeedback.  Cognitive behavioral therapy. Follow these instructions at home: Medicines  Take over-the-counter and prescription medicines only as told by your health care provider.  Ask your health care provider if the medicine prescribed to you: ? Requires you to avoid driving or using heavy machinery. ? Can cause constipation. You may need to take these actions to prevent or treat constipation:  Drink enough fluid to keep your urine pale yellow.  Take over-the-counter or prescription medicines.  Eat foods that are high in fiber, such as beans, whole grains, and fresh fruits and vegetables.  Limit foods that are high in fat and processed sugars, such as fried or sweet foods. Lifestyle  Do not drink alcohol.  Do not use any products that contain nicotine or tobacco, such as cigarettes, e-cigarettes, and chewing tobacco. If you need help quitting, ask your health care provider.  Get at least 8 hours of sleep every night.  Find ways to manage stress, such as meditation, deep breathing, or yoga. General instructions      Keep a journal to find out what may trigger your migraine headaches. For example, write down: ? What you eat and drink. ? How much sleep you get. ? Any change to your diet or  medicines.  If you have a migraine headache: ? Avoid things that make your symptoms worse, such as bright lights. ? It may help to lie down in a dark, quiet room. ? Do not drive or use heavy machinery. ? Ask your health care provider what activities are safe for you while you are experiencing symptoms.  Keep all follow-up visits as told by your health care provider. This is important. Contact a health care provider if:  You develop symptoms that are different or more severe than your usual migraine headache symptoms.  You have more than 15 headache days in one month. Get help right away if:  Your migraine headache becomes severe.  Your migraine headache lasts longer than 72 hours.  You have a fever.  You have a stiff neck.  You have vision loss.  Your muscles feel weak or like you cannot control them.  You start to lose your balance often.  You have trouble walking.  You faint.  You have a seizure. Summary  A migraine headache is an intense, throbbing pain on one side or both sides of the head. Migraines may also cause other symptoms, such as nausea, vomiting, and sensitivity to light and noise.  This condition may be treated with medicines and lifestyle changes. You may also need to avoid certain things that trigger a migraine headache.  Keep a journal to find out what may trigger your migraine headaches.  Contact your health care provider if you have more than 15 headache days in a month or you develop symptoms that are different or more severe than your usual migraine headache symptoms. This information is not intended to replace advice given to you by your health care provider. Make sure you discuss any questions you have with your health care provider. Document Revised: 12/06/2018 Document Reviewed: 09/26/2018 Elsevier Patient Education  2020 Elsevier Inc.  Carpal Tunnel Syndrome  Carpal tunnel syndrome is a condition that causes pain in your hand and arm. The  carpal tunnel is a narrow area that is on the palm side of your wrist. Repeated wrist motion or certain diseases may cause swelling in the tunnel. This swelling can pinch the main nerve in the wrist (median nerve). What are the causes? This condition may be caused by:  Repeated wrist motions.  Wrist injuries.  Arthritis.  A sac of fluid (cyst) or abnormal growth (tumor) in the carpal tunnel.  Fluid buildup during pregnancy. Sometimes the cause is not known. What increases the risk? The following factors may make you more likely to develop this condition:  Having a job in which you move your wrist in the same way many times. This includes jobs like being a Midwife or a Conservation officer, nature.  Being a woman.  Having other health conditions, such as: ? Diabetes. ? Obesity. ? A thyroid gland that is not active enough (hypothyroidism). ? Kidney failure. What are the signs or symptoms? Symptoms of this condition include:  A tingling feeling in your fingers.  Tingling or a loss of feeling (numbness) in your hand.  Pain in your entire arm. This pain may get worse when you bend your wrist and elbow for a long time.  Pain in your wrist that goes up your arm to your shoulder.  Pain that goes down into your palm or fingers.  A weak feeling in your hands. You may find it hard to grab and hold items. You may feel worse at night. How is this diagnosed? This condition is diagnosed with a medical history and physical exam. You may also have tests, such as:  Electromyogram (EMG). This test checks the signals that the nerves send to the muscles.  Nerve conduction study. This test checks how well signals pass through your nerves.  Imaging tests, such as X-rays, ultrasound, and MRI. These tests check for what might be the cause of your condition. How is this treated? This condition may be treated with:  Lifestyle changes. You will be asked to stop or change the activity that caused your  problem.  Doing exercise and activities that make bones and muscles stronger (physical therapy).  Learning how to use your hand again (occupational therapy).  Medicines for pain and swelling (inflammation). You may have injections in your wrist.  A wrist splint.  Surgery. Follow these instructions at home: If you have  a splint:  Wear the splint as told by your doctor. Remove it only as told by your doctor.  Loosen the splint if your fingers: ? Tingle. ? Lose feeling (become numb). ? Turn cold and blue.  Keep the splint clean.  If the splint is not waterproof: ? Do not let it get wet. ? Cover it with a watertight covering when you take a bath or a shower. Managing pain, stiffness, and swelling   If told, put ice on the painful area: ? If you have a removable splint, remove it as told by your doctor. ? Put ice in a plastic bag. ? Place a towel between your skin and the bag. ? Leave the ice on for 20 minutes, 2-3 times per day. General instructions  Take over-the-counter and prescription medicines only as told by your doctor.  Rest your wrist from any activity that may cause pain. If needed, talk with your boss at work about changes that can help your wrist heal.  Do any exercises as told by your doctor, physical therapist, or occupational therapist.  Keep all follow-up visits as told by your doctor. This is important. Contact a doctor if:  You have new symptoms.  Medicine does not help your pain.  Your symptoms get worse. Get help right away if:  You have very bad numbness or tingling in your wrist or hand. Summary  Carpal tunnel syndrome is a condition that causes pain in your hand and arm.  It is often caused by repeated wrist motions.  Lifestyle changes and medicines are used to treat this problem. Surgery may help in very bad cases.  Follow your doctor's instructions about wearing a splint, resting your wrist, keeping follow-up visits, and calling for  help. This information is not intended to replace advice given to you by your health care provider. Make sure you discuss any questions you have with your health care provider. Document Revised: 12/21/2017 Document Reviewed: 12/21/2017 Elsevier Patient Education  2020 [employer] Inc.  Sciatica  Sciatica is pain, weakness, tingling, or loss of feeling (numbness) along the sciatic nerve. The sciatic nerve starts in the lower back and goes down the back of each leg. Sciatica usually goes away on its own or with treatment. Sometimes, sciatica may come back (recur). What are the causes? This condition happens when the sciatic nerve is pinched or has pressure put on it. This may be the result of:  A disk in between the bones of the spine bulging out too far (herniated disk).  Changes in the spinal disks that occur with aging.  A condition that affects a muscle in the butt.  Extra bone growth near the sciatic nerve.  A break (fracture) of the area between your hip bones (pelvis).  Pregnancy.  Tumor. This is rare. What increases the risk? You are more likely to develop this condition if you:  Play sports that put pressure or stress on the spine.  Have poor strength and ease of movement (flexibility).  Have had a back injury in the past.  Have had back surgery.  Sit for long periods of time.  Do activities that involve bending or lifting over and over again.  Are very overweight (obese). What are the signs or symptoms? Symptoms can vary from mild to very bad. They may include:  Any of these problems in the lower back, leg, hip, or butt: ? Mild tingling, loss of feeling, or dull aches. ? Burning sensations. ? Sharp pains.  Loss of feeling  in the back of the calf or the sole of the foot.  Leg weakness.  Very bad back pain that makes it hard to move. These symptoms may get worse when you cough, sneeze, or laugh. They may also get worse when you sit or stand for long periods of  time. How is this treated? This condition often gets better without any treatment. However, treatment may include:  Changing or cutting back on physical activity when you have pain.  Doing exercises and stretching.  Putting ice or heat on the affected area.  Medicines that help: ? To relieve pain and swelling. ? To relax your muscles.  Shots (injections) of medicines that help to relieve pain, irritation, and swelling.  Surgery. Follow these instructions at home: Medicines  Take over-the-counter and prescription medicines only as told by your doctor.  Ask your doctor if the medicine prescribed to you: ? Requires you to avoid driving or using heavy machinery. ? Can cause trouble pooping (constipation). You may need to take these steps to prevent or treat trouble pooping:  Drink enough fluids to keep your pee (urine) pale yellow.  Take over-the-counter or prescription medicines.  Eat foods that are high in fiber. These include beans, whole grains, and fresh fruits and vegetables.  Limit foods that are high in fat and sugar. These include fried or sweet foods. Managing pain      If told, put ice on the affected area. ? Put ice in a plastic bag. ? Place a towel between your skin and the bag. ? Leave the ice on for 20 minutes, 2-3 times a day.  If told, put heat on the affected area. Use the heat source that your doctor tells you to use, such as a moist heat pack or a heating pad. ? Place a towel between your skin and the heat source. ? Leave the heat on for 20-30 minutes. ? Remove the heat if your skin turns bright red. This is very important if you are unable to feel pain, heat, or cold. You may have a greater risk of getting burned. Activity   Return to your normal activities as told by your doctor. Ask your doctor what activities are safe for you.  Avoid activities that make your symptoms worse.  Take short rests during the day. ? When you rest for a long time,  do some physical activity or stretching between periods of rest. ? Avoid sitting for a long time without moving. Get up and move around at least one time each hour.  Exercise and stretch regularly, as told by your doctor.  Do not lift anything that is heavier than 10 lb (4.5 kg) while you have symptoms of sciatica. ? Avoid lifting heavy things even when you do not have symptoms. ? Avoid lifting heavy things over and over.  When you lift objects, always lift in a way that is safe for your body. To do this, you should: ? Bend your knees. ? Keep the object close to your body. ? Avoid twisting. General instructions  Stay at a healthy weight.  Wear comfortable shoes that support your feet. Avoid wearing high heels.  Avoid sleeping on a mattress that is too soft or too hard. You might have less pain if you sleep on a mattress that is firm enough to support your back.  Keep all follow-up visits as told by your doctor. This is important. Contact a doctor if:  You have pain that: ? Wakes you up when you  are sleeping. ? Gets worse when you lie down. ? Is worse than the pain you have had in the past. ? Lasts longer than 4 weeks.  You lose weight without trying. Get help right away if:  You cannot control when you pee (urinate) or poop (have a bowel movement).  You have weakness in any of these areas and it gets worse: ? Lower back. ? The area between your hip bones. ? Butt. ? Legs.  You have redness or swelling of your back.  You have a burning feeling when you pee. Summary  Sciatica is pain, weakness, tingling, or loss of feeling (numbness) along the sciatic nerve.  This condition happens when the sciatic nerve is pinched or has pressure put on it.  Sciatica can cause pain, tingling, or loss of feeling (numbness) in the lower back, legs, hips, and butt.  Treatment often includes rest, exercise, medicines, and putting ice or heat on the affected area. This information is not  intended to replace advice given to you by your health care provider. Make sure you discuss any questions you have with your health care provider. Document Revised: 09/02/2018 Document Reviewed: 09/02/2018 Elsevier Patient Education  2020 [employer].

## 2020-03-11 ENCOUNTER — Telehealth: Payer: Self-pay | Admitting: Neurology

## 2020-03-11 NOTE — Telephone Encounter (Signed)
[employer] auth: NPR via uhc website/[employer] secondary no auth order sent to GI. They will reach out to the patient to schedule.

## 2020-03-23 ENCOUNTER — Ambulatory Visit
Admission: RE | Admit: 2020-03-23 | Discharge: 2020-03-23 | Disposition: A | Discharge status: Home / Self Care | Payer: 59 | Source: Ambulatory Visit | Attending: Neurology | Admitting: Neurology

## 2020-03-23 DIAGNOSIS — R29818 Other symptoms and signs involving the nervous system: Secondary | ICD-10-CM

## 2020-03-23 DIAGNOSIS — R202 Paresthesia of skin: Secondary | ICD-10-CM

## 2020-03-23 DIAGNOSIS — R200 Anesthesia of skin: Secondary | ICD-10-CM | POA: Diagnosis not present

## 2020-03-23 DIAGNOSIS — R519 Headache, unspecified: Secondary | ICD-10-CM | POA: Diagnosis not present

## 2020-03-23 DIAGNOSIS — R292 Abnormal reflex: Secondary | ICD-10-CM

## 2020-03-23 MED ORDER — GADOBENATE DIMEGLUMINE 529 MG/ML IV SOLN
9.0000 mL | Freq: Once | INTRAVENOUS | Status: AC | PRN
  Administered 2020-03-23: 9 mL via INTRAVENOUS

## 2020-03-29 ENCOUNTER — Telehealth (INDEPENDENT_AMBULATORY_CARE_PROVIDER_SITE_OTHER): Payer: 59 | Admitting: Pediatrics

## 2021-01-09 DIAGNOSIS — G43009 Migraine without aura, not intractable, without status migrainosus: Secondary | ICD-10-CM

## 2021-01-09 DIAGNOSIS — M5442 Lumbago with sciatica, left side: Secondary | ICD-10-CM

## 2021-01-09 DIAGNOSIS — M5441 Lumbago with sciatica, right side: Secondary | ICD-10-CM

## 2021-01-18 ENCOUNTER — Telehealth (INDEPENDENT_AMBULATORY_CARE_PROVIDER_SITE_OTHER): Payer: Medicaid Other | Admitting: Adult Health

## 2021-01-18 DIAGNOSIS — G43009 Migraine without aura, not intractable, without status migrainosus: Secondary | ICD-10-CM | POA: Diagnosis not present

## 2021-01-18 DIAGNOSIS — M5441 Lumbago with sciatica, right side: Secondary | ICD-10-CM

## 2021-01-18 DIAGNOSIS — M5442 Lumbago with sciatica, left side: Secondary | ICD-10-CM

## 2021-01-18 NOTE — Progress Notes (Addendum)
PATIENT: Deborah Mcdaniel DOB: July 10, 2001  REASON FOR VISIT: follow up HISTORY FROM: patient  Virtual Visit via Video Note  I connected with Deborah Mcdaniel on 01/18/21 at  2:30 PM EDT by a video enabled telemedicine application located remotely at [facility] and verified that I am speaking with the correct person using two identifiers who was located at their own home.   I discussed the limitations of evaluation and management by telemedicine and the availability of in person appointments. The patient expressed understanding and agreed to proceed.   PATIENT: Deborah Mcdaniel DOB: [birth year]  REASON FOR VISIT: follow up HISTORY FROM: patient  HISTORY OF PRESENT ILLNESS: Today 01/18/21:  Deborah Mcdaniel is a 20 year old female with a history of migraine headaches, bilateral hand numbness and sciatica.   Headaches: Reports that she has approximately 1 headache every 2 weeks.  She typically takes Maxalt and her headache resolves within an hour.  Remains on Cymbalta 30 mg daily.  Sciatica: Patient reports that she is no longer having numbness in the hands but rather numbness down the legs.  Reports that it starts in the back and goes down both legs.  She describes a pins and needle sensation.  She reports that she has more discomfort if she is standing for long periods of time.  She does have a back brace that she wears during the day.  The patient has never had any imaging or work-up for back pain.    HISTORY 03/10/20: Deborah Mcdaniel is a 20 y.o. female here as requested by Marylen Ponto, MD for "pinched nerve".  She has a past medical history of sciatica, chronic tension type headache, anxiety, asthma, depression, migraine, PTSD.Patient was recently seen in the emergency room February 28, 2020 with complaints of headache onset 2 to 3 days prior, located at the very top of her head but radiating down the entire right side of her body to her toes.  This is intermittent pain, mild and achy  at times but then she will have stabbing pain that radiates all the way down to her toes.  The pain is 3 out of 10 and can be 10 out of 10 at its worst.  She also reported a history of migraines, tried taking Imitrex but it did not work, reported associated nausea.  She also reported she lost consciousness for less than a minute and fell to the floor.  She denied vomiting, visual disturbances, photophobia, weakness, numbness, tingling, chest pain, shortness of breath, seizure, palpitations or other associated symptoms.  Examination was normal and she was in no acute distress.  Labs included a BMP with BUN of 11 and creatinine 0.76 which is normal.  CT of the head showed no acute intracranial abnormality, normal brain (reviewed report).  Potassium was slightly low, she was reassured and she felt better, she was given a migraine cocktail, and discharged.  She reports today she has a "pinched nerve". She was in the ED for migraines recently. She has shooting pain down the right side from the neck to the fingers, and goes down the leg the same thing that happened din the ED in the setting of a headache. It was her face, the arm and the leg in a setting of a headache. She has had sciatica in the past and this was different. She has had migraine since she was little, she went to the ED and had stabbing pain on top of the head, migraines are usually in the back  of the head or in the temples, she gets nauseated and pulsating and pounding throbbing, light and sound sensitivity, she has a family history of migraines, face was involved that day. She has had the shooting pain in the arm and the leg the arm goes numb. Both hands usually go numb together not always with the leg, the leg shooting pain is more like her sciatica. She gets headaches once a week, she takes imitrex and ibuprofen. Sleeping helps. Here with boyfriend who also provides information. She has palpitations and has seen crdiology. No other focal neurologic  deficits, associated symptoms, inciting events or modifiable factors.  Reviewed notes, labs and imaging from outside physicians, which showed:  CT head (reviewed report) normal 02/28/2020:  Brain: No evidence of acute infarction, hemorrhage, hydrocephalus,  extra-axial collection or mass lesion/mass effect.   Vascular: No hyperdense vessel or unexpected calcification.   Skull: Normal. Negative for fracture or focal lesion.   Sinuses/Orbits: No acute finding.   Other: None.   IMPRESSION:  1. No acute intracranial abnormalities. The appearance of the brain is normal.   02/28/2020: urine drug screen neg, ethanol negative, bmo with K 3., BUN 11, Creat 0.76 otherwise normal, cbc normal, lfts normal,  reviewd images XR c-spine 2019 and agree with the following: EXAM: CERVICAL SPINE - COMPLETE 4+ VIEW  COMPARISON: None.  FINDINGS: There is no evidence of cervical spine fracture or prevertebral soft tissue swelling. Alignment is normal. No other significant bone abnormalities are identified.  IMPRESSION: Negative cervical spine radiographs.   REVIEW OF SYSTEMS: Out of a complete 14 system review of symptoms, the patient complains only of the following symptoms, and all other reviewed systems are negative.  ALLERGIES: No Known Allergies  HOME MEDICATIONS: Outpatient Medications Prior to Visit  Medication Sig Dispense Refill  . busPIRone (BUSPAR) 15 MG tablet Take 15 mg by mouth daily.     . DULoxetine (CYMBALTA) 30 MG capsule Take 1 capsule (30 mg total) by mouth daily. 90 capsule 3  . guanFACINE (TENEX) 1 MG tablet Take 1 mg by mouth at bedtime.    . mirtazapine (REMERON) 30 MG tablet Take 30 mg by mouth at bedtime.     . Multiple Vitamin (MULTIVITAMIN) tablet Take 1 tablet by mouth daily. Patient takes vit d and potassium    . rizatriptan (MAXALT-MLT) 10 MG disintegrating tablet Take 1 tablet (10 mg total) by mouth as needed for migraine. May repeat in 2 hours if needed  9 tablet 11   No facility-administered medications prior to visit.    PAST MEDICAL HISTORY: Past Medical History:  Diagnosis Date  . Anxiety   . Cardiac murmur 07/29/2019  . Chest pain of uncertain etiology 10/06/2019  . Chronic tension-type headache, not intractable 09/03/2017  . Depression   . Migraines   . Muscle cramp 11/12/2017  . Palpitations 07/29/2019  . Panic attacks   . Paroxysmal SVT (supraventricular tachycardia) (HCC) 09/08/2019  . PTSD (post-traumatic stress disorder)   . Sciatica of left side 09/03/2017  . Sciatica of right side 11/12/2017  . Tachycardia   . Transient alteration of awareness 11/08/2017    PAST SURGICAL HISTORY: Past Surgical History:  Procedure Laterality Date  . NO PAST SURGERIES      FAMILY HISTORY: Family History  Problem Relation Age of Onset  . Migraines Mother   . Depression Mother   . Anxiety disorder Mother   . ADD / ADHD Mother   . Fibromyalgia Mother   . Other Mother  myelomalacia  . Depression Father   . Anxiety disorder Father   . ADD / ADHD Cousin   . Seizures Neg Hx   . Bipolar disorder Neg Hx   . Schizophrenia Neg Hx   . Autism Neg Hx     SOCIAL HISTORY: Social History   Socioeconomic History  . Marital status: Single    Spouse name: Not on file  . Number of children: Not on file  . Years of education: Not on file  . Highest education level: Not on file  Occupational History  . Not on file  Tobacco Use  . Smoking status: Never Smoker  . Smokeless tobacco: Never Used  Vaping Use  . Vaping Use: Never used  Substance and Sexual Activity  . Alcohol use: No  . Drug use: No  . Sexual activity: Not on file  Other Topics Concern  . Not on file  Social History Narrative   [name] is a sophomore at [employer]; she does well in school but misses a lot of school due to pain. She lives with her mother. 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       [facility]sel[facility] at [facility]- once a month (sees a nurse but  doesn't do counseling as of 03/10/2020)      Right handed      Caffeine: none    Social Determinants of Health   Financial Resource Strain: Not on file  Food Insecurity: Not on file  Transportation Needs: Not on file  Physical Activity: Not on file  Stress: Not on file  Social Connections: Not on file  Intimate Partner Violence: Not on file      PHYSICAL EXAM Generalized: Well developed, in no acute distress   Neurological examination  Mentation: Alert oriented to time, place, history taking. Follows all commands speech and language fluent Cranial nerve II-XII:Extraocular movements were full. Facial symmetry noted. uvula tongue midline. Head turning and shoulder shrug  were normal and symmetric. Motor: Good strength throughout subjectively per patient Sensory: Sensory testing is intact to soft touch on all 4 extremities subjectively per patient Coordination: Cerebellar testing reveals good finger-nose-finger  Gait and station: Patient is able to stand from a seated position. gait is normal.  Reflexes: UTA  DIAGNOSTIC DATA (LABS, IMAGING, TESTING) - I reviewed patient records, labs, notes, testing and imaging myself where available.  No results found for: WBC, HGB, HCT, MCV, PLT    Component Value Date/Time   NA 140 01/12/2020 1052   K 4.3 01/12/2020 1052   CL 100 01/12/2020 1052   CO2 25 01/12/2020 1052   GLUCOSE 60 (L) 01/12/2020 1052   BUN 11 01/12/2020 1052   CREATININE 0.89 01/12/2020 1052   CALCIUM 9.5 01/12/2020 1052   GFRNONAA 94 01/12/2020 1052   GFRAA 109 01/12/2020 1052   No results found for: CHOL, HDL, LDLCALC, LDLDIRECT, TRIG, CHOLHDL No results found for: YNWG9FHGBA1C No results found for: VITAMINB12 Lab Results  Component Value Date   TSH 1.100 01/12/2020      ASSESSMENT AND PLAN 20 y.o. year old female  has a past medical history of Anxiety, Cardiac murmur (07/29/2019), Chest pain of uncertain etiology (10/06/2019), Chronic tension-type headache, not  intractable (09/03/2017), Depression, Migraines, Muscle cramp (11/12/2017), Palpitations (07/29/2019), Panic attacks, Paroxysmal SVT (supraventricular tachycardia) (HCC) (09/08/2019), PTSD (post-traumatic stress disorder), Sciatica of left side (09/03/2017), Sciatica of right side (11/12/2017), Tachycardia, and Transient alteration of awareness (11/08/2017). here with:  1.  Migraine headaches  --Continue Cymbalta 30 mg  daily --Continue Maxalt at the onset of a migraine for abortive therapy  2.  Back pain  -Advised patient that she needs a formal work-up offered her an in office  Visit.  Patient states that she lives in [place] and plans to establish care there.  She is asking for a note that will allow her to have breaks during the day due to her back pain.  Advised that I would need to see the patient in the office and have a formal work-up before I can provide her with a doctor's note.  She voiced understanding.    Patient plans to establish care in [facility] we will follow-up with a neurologist there.   Butch Penny, MSN, NP-C 01/18/2021, 3:09 PM [facility] [address], [address] [place], [place] [address] [phone]  agree with assessment and plan as stated.     Naomie Dean, MD [facility]

## 2021-01-20 NOTE — Telephone Encounter (Signed)
Ok to put a referral in

## 2021-01-20 NOTE — Telephone Encounter (Signed)
[facility] [place] has been placed.

## 2021-01-20 NOTE — Addendum Note (Signed)
Addended by: Bertram Savin on: 01/20/2021 01:58 PM   Modules accepted: Orders

## 2021-02-01 ENCOUNTER — Telehealth: Payer: Self-pay

## 2021-02-01 NOTE — Telephone Encounter (Signed)
Referral sent to [facility] in [place], [place]. [place]: [phone]. [place]: [phone].

## 2023-03-21 ENCOUNTER — Ambulatory Visit (INDEPENDENT_AMBULATORY_CARE_PROVIDER_SITE_OTHER): Payer: Medicaid Other | Admitting: Allergy and Immunology

## 2023-03-21 ENCOUNTER — Encounter: Payer: Self-pay | Admitting: Allergy and Immunology

## 2023-03-21 VITALS — BP 110/82 | HR 88 | Ht 61.25 in | Wt 117.0 lb

## 2023-03-21 DIAGNOSIS — J3089 Other allergic rhinitis: Secondary | ICD-10-CM

## 2023-03-21 DIAGNOSIS — J301 Allergic rhinitis due to pollen: Secondary | ICD-10-CM

## 2023-03-21 DIAGNOSIS — T63481A Toxic effect of venom of other arthropod, accidental (unintentional), initial encounter: Secondary | ICD-10-CM | POA: Diagnosis not present

## 2023-03-21 DIAGNOSIS — J4520 Mild intermittent asthma, uncomplicated: Secondary | ICD-10-CM

## 2023-03-21 MED ORDER — AIRSUPRA 90-80 MCG/ACT IN AERO: 1.0000 | INHALATION_SPRAY | RESPIRATORY_TRACT | 3 refills | Status: AC | PRN

## 2023-03-21 MED ORDER — HALOBETASOL PROPIONATE 0.05 % EX CREA: TOPICAL_CREAM | Freq: Two times a day (BID) | CUTANEOUS | 3 refills | Status: AC | PRN

## 2023-03-21 MED ORDER — CETIRIZINE HCL 10 MG PO TABS: ORAL_TABLET | ORAL | 5 refills | Status: AC

## 2023-03-21 MED ORDER — FAMOTIDINE 20 MG PO TABS: 20.0000 mg | ORAL_TABLET | Freq: Two times a day (BID) | ORAL | 5 refills | Status: AC

## 2023-03-21 NOTE — Patient Instructions (Addendum)
  1. Treatment for large local reactions:   A. Cetirizine 10 mg - 1 tablet 2 times per day  B. Famotidine 20 mg - 1 tablet 2 times per day  C. Ultravate cream - apply 2 times per day  2. For airway allergies:   A. Cetirizine 10 mg - 1 tablet 1 time per day  3. For asthma:   A. AirSupra - 2 inhalations every 6 hours  4. If systemic reactions to insect stings contact this clinic.  5. Plan for fall flu vaccine  6. Return to clinic if problem

## 2023-03-21 NOTE — Progress Notes (Unsigned)
[place]   Dear Deborah Mcdaniel,  Thank you for referring Deborah Mcdaniel to the [facility] [place] on 03/21/2023.   Below is a summation of this patient's evaluation and recommendations.  Thank you for your referral. I will keep you informed about this patient's response to treatment.   If you have any questions please do not hesitate to contact me.   Sincerely,  Deborah Priest, MD Allergy / Immunology [place]   ______________________________________________________________________    NEW PATIENT NOTE  Referring Provider: Sheliah Mcdaniel Primary Provider: Sherolyn Buba, DO Date of office visit: 03/21/2023    Subjective:   Chief Complaint:  Deborah Mcdaniel (DOB: [birth year]) is a 22 y.o. female who presents to the clinic on 03/21/2023 with a chief complaint of Allergic Reaction (Bug bite in leg ) and Asthma .     HPI: Deborah Mcdaniel presents to this clinic in evaluation of several issues.  First, she has developed a large local reaction to hymenoptera venom exposure.  A few months ago she was in the shower and she felt intense pain in the back of her leg and within 1 hour that area became very red and swollen and actually turned a little bit purple and resolved over the course of the next 3 to 4 days without any associated systemic or constitutional symptoms and no healing with scar or hyperpigmentation.  It was intensely itchy.  She took some Benadryl for this reaction.  Second, she has a history of developing large local reactions to mosquito stings once again without any associated systemic or constitutional symptoms.  Third, she has a history of perennial nasal congestion and sneezing that appears to flare during the spring and fall but is easily handled with the use of Zyrtec.  She does think that Zyrtec results in very good control of this  issue.  Fourth, she has a history of asthma that has been present since childhood.  She rarely gets episodes of chest tightness and shortness of breath and she rarely requires a short acting bronchodilator.  There does appear to be some cold air induced bronchospastic symptoms and exercise-induced bronchospastic symptoms and requirement for short acting bronchodilators about 1 time per week usually during the winter.  Fifth, she has developed a contact dermatitis when putting on certain types of close at [facility].  Within 20 minutes she developed red blotchy areas with exposure to a jacket that she placed on her upper body but fortunately there was no associated systemic or constitutional symptoms and this reaction resolved over hours.  Past Medical History:  Diagnosis Date   Anxiety    Cardiac murmur 07/29/2019   Chest pain of uncertain etiology 10/06/2019   Chronic tension-type headache, not intractable 09/03/2017   Depression    Migraines    Muscle cramp 11/12/2017   Palpitations 07/29/2019   Panic attacks    Paroxysmal SVT (supraventricular tachycardia) 09/08/2019   PTSD (post-traumatic stress disorder)    Sciatica of left side 09/03/2017   Sciatica of right side 11/12/2017   Tachycardia    Transient alteration of awareness 11/08/2017    Past Surgical History:  Procedure Laterality Date   NO PAST SURGERIES      Allergies as of 03/21/2023   No Known Allergies      Medication List    albuterol 108 (90 Base) MCG/ACT inhaler Commonly known as: VENTOLIN HFA Inhale  into the lungs.   ARIPiprazole 2 MG tablet Commonly known as: ABILIFY Take 2 mg by mouth daily.   busPIRone 15 MG tablet Commonly known as: BUSPAR Take 15 mg by mouth daily.   cetirizine 10 MG tablet Commonly known as: ZYRTEC Take 1 tablet 1-2 times daily for reactions   DULoxetine 30 MG capsule Commonly known as: Cymbalta Take 1 capsule (30 mg total) by mouth daily.   escitalopram 10 MG tablet Commonly known  as: LEXAPRO Take 10 mg by mouth daily.   famotidine 40 MG tablet Commonly known as: PEPCID Take by mouth.   famotidine 20 MG tablet Commonly known as: PEPCID Take 1 tablet (20 mg total) by mouth 2 (two) times daily.   guanFACINE 1 MG tablet Commonly known as: TENEX Take 1 mg by mouth at bedtime.   halobetasol 0.05 % cream Commonly known as: ULTRAVATE Apply topically 2 (two) times daily as needed (for local reactions). Started by: Amarien Carne J Agness Sibrian   Haloette 0.12-0.015 MG/24HR vaginal ring Generic drug: etonogestrel-ethinyl estradiol Place vaginally.   hydrocortisone cream 1 % Apply topically 2 (two) times daily.   lamoTRIgine 200 MG tablet Commonly known as: LAMICTAL Take 200 mg by mouth daily.   mirtazapine 30 MG tablet Commonly known as: REMERON Take 30 mg by mouth at bedtime.   multivitamin tablet Take 1 tablet by mouth daily. Patient takes vit d and potassium   propranolol 10 MG tablet Commonly known as: INDERAL Take 10 mg by mouth 2 (two) times daily.   rizatriptan 10 MG disintegrating tablet Commonly known as: MAXALT-MLT Take 1 tablet (10 mg total) by mouth as needed for migraine. May repeat in 2 hours if needed    Review of systems negative except as noted in HPI / PMHx or noted below:  Review of Systems  Constitutional: Negative.   HENT: Negative.    Eyes: Negative.   Respiratory: Negative.    Cardiovascular: Negative.   Gastrointestinal: Negative.   Genitourinary: Negative.   Musculoskeletal: Negative.   Skin: Negative.   Neurological: Negative.   Endo/Heme/Allergies: Negative.   Psychiatric/Behavioral: Negative.      Family History  Problem Relation Age of Onset   Asthma Mother    Allergic rhinitis Mother    Migraines Mother    Depression Mother    Anxiety disorder Mother    ADD / ADHD Mother    Fibromyalgia Mother    Other Mother        myelomalacia   Sinusitis Mother    Depression Father    Anxiety disorder Father    COPD Maternal  Grandmother    ADD / ADHD Cousin    Seizures Neg Hx    Bipolar disorder Neg Hx    Schizophrenia Neg Hx    Autism Neg Hx    Immunodeficiency Neg Hx    Eczema Neg Hx    Angioedema Neg Hx    Atopy Neg Hx     Social History   Socioeconomic History   Marital status: Single    Spouse name: Not on file   Number of children: Not on file   Years of education: Not on file   Highest education level: Not on file  Occupational History   Not on file  Tobacco Use   Smoking status: Never    Passive exposure: Never   Smokeless tobacco: Never  Vaping Use   Vaping status: Never Used  Substance and Sexual Activity   Alcohol use: No   Drug use: No  Sexual activity: Not on file  Other Topics Concern   Not on file  Social History Narrative   Sapir is a sophomore at [employer]; she does well in school but misses a lot of school due to pain. She lives with her mother. 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       Counseling at [employer]- once a month (sees a nurse but doesn't do counseling as of 03/10/2020)      Right handed      Caffeine: none    Environmental and Social history  Lives in a condominium with a dry environment, cats and dogs located inside the household, no carpet in the bedroom, no plastic on the bed, no plastic on the pillow, no smoking ongoing with inside the household.  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  Objective:   Vitals:   03/21/23 1436  BP: 110/82  Pulse: 88  SpO2: 98%   Height: 5' 1.25" (155.6 cm) Weight: 117 lb (53.1 kg)  Physical Exam Constitutional:      Appearance: She is not diaphoretic.  HENT:     Head: Normocephalic.     Right Ear: Tympanic membrane, ear canal and external ear normal.     Left Ear: Tympanic membrane, ear canal and external ear normal.     Nose: Nose normal. No mucosal edema or rhinorrhea.     Mouth/Throat:     Pharynx: Uvula midline. No oropharyngeal exudate.  Eyes:     Conjunctiva/sclera:  Conjunctivae normal.  Neck:     Thyroid: No thyromegaly.     Trachea: Trachea normal. No tracheal tenderness or tracheal deviation.  Cardiovascular:     Rate and Rhythm: Normal rate and regular rhythm.     Heart sounds: Normal heart sounds, S1 normal and S2 normal. No murmur heard. Pulmonary:     Effort: No respiratory distress.     Breath sounds: Normal breath sounds. No stridor. No wheezing or rales.  Lymphadenopathy:     Head:     Right side of head: No tonsillar adenopathy.     Left side of head: No tonsillar adenopathy.     Cervical: No cervical adenopathy.  Skin:    Findings: No erythema or rash.     Nails: There is no clubbing.  Neurological:     Mental Status: She is alert.     Diagnostics: Allergy skin tests were not performed.   Spirometry was performed and demonstrated an FEV1 of 2.83 @ 94 % of predicted. FEV1/FVC = 0.80  Assessment and Plan:    1. Asthma, mild intermittent, well-controlled   2. Perennial allergic rhinitis   3. Seasonal allergic rhinitis due to pollen   4. Local reaction to hymenoptera sting    1. Treatment for large local reactions:   A. Cetirizine 10 mg - 1 tablet 2 times per day  B. Famotidine 20 mg - 1 tablet 2 times per day  C. Ultravate cream - apply 2 times per day  2. For airway allergies:   A. Cetirizine 10 mg - 1 tablet 1 time per day  3. For asthma:   A. AirSupra - 2 inhalations every 6 hours  4. If systemic reactions to insect stings contact this clinic.  5. Plan for fall flu vaccine  6. Return to clinic if problem  Autumne has developed a large local reaction to hymenoptera venom exposure without any associated systemic or constitutional symptoms and thus there is no need for any  further evaluation and she is not at risk of developing a severe allergic reaction with subsequent exposure to hymenoptera venom above and beyond that found in the general population.  Certainly if she ever develops a reaction distant from her  sting site she will need to contact us and we will need to have her undergo further evaluation for possible immunotherapy.  I have given her a plan to utilize for her large local reactions to hymenoptera venom and mosquito bites as noted above.  She has atopic airway disease that is under good control with cetirizine and intermittent use of a rescue inhaler.  I have given her an anti-inflammatory rescue medicine to use should it be required.  She will keep in contact with me noting her response to this plan and we will have her undergo further evaluation and treatment if she develops problems in the face of this plan.  Deborah Priest, MD Allergy / Immunology [facility] [place]

## 2023-03-22 ENCOUNTER — Encounter: Payer: Self-pay | Admitting: Allergy and Immunology

## 2023-03-23 ENCOUNTER — Other Ambulatory Visit (HOSPITAL_COMMUNITY): Payer: Self-pay

## 2023-03-23 ENCOUNTER — Telehealth: Payer: Self-pay

## 2023-03-23 NOTE — Telephone Encounter (Signed)
Received fax determination- Airsupra denied. Failure of Dulera, Symbicort, Advair HFA, Advair Diskus required.

## 2023-03-23 NOTE — Telephone Encounter (Signed)
*  Asthma/Allergy  Pharmacy Patient Advocate Encounter   Received notification from CoverMyMeds that prior authorization for Airsupra 90-80MCG/ACT aerosol is required/requested.   Insurance verification completed.   The patient is insured through  [employer]  .   Per test claim: PA required; PA submitted to [employer] via [employer] Key/confirmation #/EOC [id number] Status is pending

## 2023-03-23 NOTE — Telephone Encounter (Signed)
New PA submitted with corrected patient ID info  Key: [id number]

## 2023-04-05 ENCOUNTER — Encounter: Payer: Self-pay | Admitting: *Deleted

## 2023-04-05 MED ORDER — FLUTICASONE PROPIONATE HFA 44 MCG/ACT IN AERO: 2.0000 | INHALATION_SPRAY | RESPIRATORY_TRACT | 5 refills | Status: AC | PRN

## 2023-04-05 MED ORDER — ALBUTEROL SULFATE HFA 108 (90 BASE) MCG/ACT IN AERS: 2.0000 | INHALATION_SPRAY | RESPIRATORY_TRACT | 1 refills | Status: DC | PRN

## 2023-04-05 NOTE — Addendum Note (Signed)
Addended by: Maryjean Morn D on: 04/05/2023 01:47 PM   Modules accepted: Orders

## 2023-05-07 ENCOUNTER — Other Ambulatory Visit: Payer: Self-pay | Admitting: Allergy and Immunology

## 2023-07-09 ENCOUNTER — Other Ambulatory Visit: Payer: Self-pay | Admitting: Allergy and Immunology

## 2023-07-20 ENCOUNTER — Emergency Department (HOSPITAL_COMMUNITY)
Admission: EM | Admit: 2023-07-20 | Discharge: 2023-07-20 | Disposition: A | Discharge status: Home / Self Care | Payer: Medicaid Other | Attending: Emergency Medicine | Admitting: Emergency Medicine

## 2023-07-20 ENCOUNTER — Emergency Department (HOSPITAL_COMMUNITY): Payer: Medicaid Other

## 2023-07-20 ENCOUNTER — Other Ambulatory Visit: Payer: Self-pay

## 2023-07-20 ENCOUNTER — Encounter (HOSPITAL_COMMUNITY): Payer: Self-pay

## 2023-07-20 DIAGNOSIS — R55 Syncope and collapse: Secondary | ICD-10-CM | POA: Diagnosis not present

## 2023-07-20 DIAGNOSIS — G40909 Epilepsy, unspecified, not intractable, without status epilepticus: Secondary | ICD-10-CM | POA: Insufficient documentation

## 2023-07-20 DIAGNOSIS — R569 Unspecified convulsions: Secondary | ICD-10-CM

## 2023-07-20 LAB — CBC WITH DIFFERENTIAL/PLATELET
Abs Immature Granulocytes: 0.01 K/uL (ref 0.00–0.07)
Basophils Absolute: 0 K/uL (ref 0.0–0.1)
Basophils Relative: 1 %
Eosinophils Absolute: 0 K/uL (ref 0.0–0.5)
Eosinophils Relative: 1 %
HCT: 43.4 % (ref 36.0–46.0)
Hemoglobin: 14.5 g/dL (ref 12.0–15.0)
Immature Granulocytes: 0 %
Lymphocytes Relative: 35 %
Lymphs Abs: 2.1 K/uL (ref 0.7–4.0)
MCH: 29.8 pg (ref 26.0–34.0)
MCHC: 33.4 g/dL (ref 30.0–36.0)
MCV: 89.1 fL (ref 80.0–100.0)
Monocytes Absolute: 0.3 K/uL (ref 0.1–1.0)
Monocytes Relative: 5 %
Neutro Abs: 3.5 K/uL (ref 1.7–7.7)
Neutrophils Relative %: 58 %
Platelets: 251 K/uL (ref 150–400)
RBC: 4.87 MIL/uL (ref 3.87–5.11)
RDW: 11.4 % — ABNORMAL LOW (ref 11.5–15.5)
WBC: 6 K/uL (ref 4.0–10.5)
nRBC: 0 % (ref 0.0–0.2)

## 2023-07-20 LAB — COMPREHENSIVE METABOLIC PANEL
ALT: 11 U/L (ref 0–44)
AST: 19 U/L (ref 15–41)
Albumin: 3.9 g/dL (ref 3.5–5.0)
Alkaline Phosphatase: 66 U/L (ref 38–126)
Anion gap: 8 (ref 5–15)
BUN: 9 mg/dL (ref 6–20)
CO2: 24 mmol/L (ref 22–32)
Calcium: 9.1 mg/dL (ref 8.9–10.3)
Chloride: 108 mmol/L (ref 98–111)
Creatinine, Ser: 0.87 mg/dL (ref 0.44–1.00)
GFR, Estimated: >60 mL/min (ref >60)
Glucose, Bld: 99 mg/dL (ref 70–99)
Potassium: 3.5 mmol/L (ref 3.5–5.1)
Sodium: 140 mmol/L (ref 135–145)
Total Bilirubin: 0.2 mg/dL (ref <1.2)
Total Protein: 6.7 g/dL (ref 6.5–8.1)

## 2023-07-20 MED ORDER — FENTANYL CITRATE PF 50 MCG/ML IJ SOSY
25.0000 ug | PREFILLED_SYRINGE | Freq: Once | INTRAMUSCULAR | Status: AC
  Administered 2023-07-20: 25 ug via INTRAVENOUS
  Filled 2023-07-20: qty 1

## 2023-07-20 MED ORDER — LAMOTRIGINE 25 MG PO TABS: 125.0000 mg | ORAL_TABLET | Freq: Two times a day (BID) | ORAL | 0 refills | Status: AC

## 2023-07-20 NOTE — ED Provider Triage Note (Signed)
Emergency Medicine Provider Triage Evaluation Note  Deborah Mcdaniel , a 22 y.o. female  was evaluated in triage.  Pt complains of seizure..  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  The last time she had this was 5 years ago.  Review of Systems  Positive: Headache Negative: Fevers and chills  Physical Exam  BP 115/77 (BP Location: Right Arm)   Pulse 86   Temp 98.7 F (37.1 C) (Oral)   Resp 16   LMP 07/15/2023 (Approximate)   SpO2 100%  Gen:   Awake, no distress   Resp:  Normal effort  MSK:   Moves extremities without difficulty  Other:  Neuroexam normal female  Medical Decision Making  Medically screening exam initiated at 7:53 PM.  Appropriate orders placed.  Deborah Mcdaniel was informed that the remainder of the evaluation will be completed by another provider, this initial triage assessment does not replace that evaluation, and the importance of remaining in the ED until their evaluation is complete.     Bethann Berkshire, MD 07/20/23 [phone]
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
## 2023-07-20 NOTE — ED Notes (Signed)
Patient transported to CT 

## 2023-07-20 NOTE — Discharge Instructions (Signed)
You were seen in the ER today for seizure-like activity. Your labs and imaging were reassuring with no acute abnormality seen. I spoke with our neurologist, Dr. Amada Jupiter, who advised following with up with outpatient neurology for EEG and increasing your lamictal dose from 200mg  daily to 125mg  twice daily. If symptoms worsen or recur, please return to the ER. I placed a referral to neurology to have you follow up with them within the next week if possible.
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
## 2023-07-21 ENCOUNTER — Encounter (HOSPITAL_COMMUNITY): Payer: Self-pay

## 2023-07-21 ENCOUNTER — Emergency Department (HOSPITAL_COMMUNITY)
Admission: EM | Admit: 2023-07-21 | Discharge: 2023-07-21 | Disposition: A | Discharge status: Home / Self Care | Payer: Medicaid Other | Attending: Emergency Medicine | Admitting: Emergency Medicine

## 2023-07-21 DIAGNOSIS — R569 Unspecified convulsions: Secondary | ICD-10-CM | POA: Diagnosis present

## 2023-07-21 HISTORY — DX: Encounter for other specified aftercare: Z51.89

## 2023-07-21 LAB — PREGNANCY, URINE: Preg Test, Ur: NEGATIVE

## 2023-07-21 LAB — CBC WITH DIFFERENTIAL/PLATELET
Abs Immature Granulocytes: 0.02 10*3/uL (ref 0.00–0.07)
Basophils Absolute: 0 10*3/uL (ref 0.0–0.1)
Basophils Relative: 0 %
Eosinophils Absolute: 0 10*3/uL (ref 0.0–0.5)
Eosinophils Relative: 0 %
HCT: 43.8 % (ref 36.0–46.0)
Hemoglobin: 14.6 g/dL (ref 12.0–15.0)
Immature Granulocytes: 0 %
Lymphocytes Relative: 27 %
Lymphs Abs: 1.8 10*3/uL (ref 0.7–4.0)
MCH: 29.1 pg (ref 26.0–34.0)
MCHC: 33.3 g/dL (ref 30.0–36.0)
MCV: 87.4 fL (ref 80.0–100.0)
Monocytes Absolute: 0.3 10*3/uL (ref 0.1–1.0)
Monocytes Relative: 5 %
Neutro Abs: 4.7 10*3/uL (ref 1.7–7.7)
Neutrophils Relative %: 68 %
Platelets: 271 10*3/uL (ref 150–400)
RBC: 5.01 MIL/uL (ref 3.87–5.11)
RDW: 11.4 % — ABNORMAL LOW (ref 11.5–15.5)
WBC: 6.9 10*3/uL (ref 4.0–10.5)
nRBC: 0 % (ref 0.0–0.2)

## 2023-07-21 LAB — CBG MONITORING, ED: Glucose-Capillary: 88 mg/dL (ref 70–99)

## 2023-07-21 LAB — URINALYSIS, W/ REFLEX TO CULTURE (INFECTION SUSPECTED)
Bacteria, UA: NONE SEEN
Bilirubin Urine: NEGATIVE
Glucose, UA: NEGATIVE mg/dL
Ketones, ur: 20 mg/dL — AB
Leukocytes,Ua: NEGATIVE
Nitrite: NEGATIVE
Protein, ur: NEGATIVE mg/dL
Specific Gravity, Urine: 1.013 (ref 1.005–1.030)
pH: 7 (ref 5.0–8.0)

## 2023-07-21 LAB — RAPID URINE DRUG SCREEN, HOSP PERFORMED
Amphetamines: NOT DETECTED
Barbiturates: NOT DETECTED
Benzodiazepines: NOT DETECTED
Cocaine: NOT DETECTED
Opiates: NOT DETECTED
Tetrahydrocannabinol: POSITIVE — AB

## 2023-07-21 LAB — BASIC METABOLIC PANEL
Anion gap: 11 (ref 5–15)
BUN: 10 mg/dL (ref 6–20)
CO2: 19 mmol/L — ABNORMAL LOW (ref 22–32)
Calcium: 9.4 mg/dL (ref 8.9–10.3)
Chloride: 106 mmol/L (ref 98–111)
Creatinine, Ser: 0.77 mg/dL (ref 0.44–1.00)
GFR, Estimated: >60 mL/min (ref >60)
Glucose, Bld: 91 mg/dL (ref 70–99)
Potassium: 3.6 mmol/L (ref 3.5–5.1)
Sodium: 136 mmol/L (ref 135–145)

## 2023-07-21 LAB — HCG, SERUM, QUALITATIVE: Preg, Serum: NEGATIVE

## 2023-07-21 LAB — ETHANOL: Alcohol, Ethyl (B): <10 mg/dL (ref <10)

## 2023-07-21 MED ORDER — LORAZEPAM 2 MG/ML IJ SOLN
INTRAMUSCULAR | Status: AC
  Filled 2023-07-21: qty 1

## 2023-07-21 MED ORDER — CLONAZEPAM 0.5 MG PO TABS: 0.5000 mg | ORAL_TABLET | Freq: Two times a day (BID) | ORAL | 0 refills | Status: DC

## 2023-07-21 MED ORDER — LORAZEPAM 2 MG/ML IJ SOLN
1.0000 mg | Freq: Once | INTRAMUSCULAR | Status: AC
  Administered 2023-07-21: 1 mg via INTRAVENOUS
  Filled 2023-07-21: qty 1

## 2023-07-21 MED ORDER — LORAZEPAM 2 MG/ML IJ SOLN
1.0000 mg | Freq: Once | INTRAMUSCULAR | Status: AC
  Administered 2023-07-21: 1 mg via INTRAVENOUS
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
## 2023-07-21 NOTE — Discharge Instructions (Addendum)
Return for any problem.  ?

## 2023-07-21 NOTE — ED Notes (Signed)
Family reports pt was having a seizure, when the nurse arrived the pt was shaking and when foot stimulation was performed she grimaced in pain and verbalized her discomfort. Vital signs were stable the entire time.

## 2023-07-21 NOTE — ED Triage Notes (Signed)
Pt is coming in from home with another cluster of " seizure " like activity, Pt had the same yesterday, she had a concussion 2 weeks prior to this. No headache, no loss of bowel or bladder. No tonic clonic seizure seemed to be more upper body shaking. No post ictal state   Medic vitals    130/80 88hr 97%ra 16rr 86bgl  22g left hand   4mg  zofran

## 2023-07-22 ENCOUNTER — Telehealth: Payer: Self-pay

## 2023-07-22 ENCOUNTER — Telehealth (HOSPITAL_COMMUNITY): Payer: Self-pay | Admitting: Emergency Medicine

## 2023-07-22 MED ORDER — CLONAZEPAM 0.5 MG PO TABS: 0.5000 mg | ORAL_TABLET | Freq: Two times a day (BID) | ORAL | 0 refills | Status: AC

## 2023-07-22 NOTE — Telephone Encounter (Signed)
Patient was seen in the ED yesterday for seizure like activity. Was recommended Klonopin by neurology 0.5 mg BID for 2-3 days. Patient called asking prescription to be sent to different pharmacy. Prescription was re-sent to local preferred pharmacy.

## 2023-07-22 NOTE — Telephone Encounter (Signed)
Patient is in [place] and requests to have medication sent to [facility] on [address] in [place], [place] with Dr Theresia Lo whom routed the medication to requested pharmacy

## 2023-08-06 ENCOUNTER — Other Ambulatory Visit: Payer: Self-pay | Admitting: Allergy and Immunology

## 2023-10-02 ENCOUNTER — Other Ambulatory Visit: Payer: Self-pay | Admitting: Allergy and Immunology

## 2023-11-24 ENCOUNTER — Other Ambulatory Visit: Payer: Self-pay

## 2023-11-24 ENCOUNTER — Emergency Department
Admission: EM | Admit: 2023-11-24 | Discharge: 2023-11-25 | Disposition: A | Discharge status: Home / Self Care | Attending: Emergency Medicine | Admitting: Emergency Medicine

## 2023-11-24 DIAGNOSIS — R569 Unspecified convulsions: Secondary | ICD-10-CM | POA: Diagnosis present

## 2023-11-24 DIAGNOSIS — F445 Conversion disorder with seizures or convulsions: Secondary | ICD-10-CM | POA: Diagnosis not present

## 2023-11-24 LAB — URINALYSIS, ROUTINE W REFLEX MICROSCOPIC
Bilirubin Urine: NEGATIVE
Glucose, UA: NEGATIVE mg/dL
Hgb urine dipstick: NEGATIVE
Ketones, ur: 5 mg/dL — AB
Nitrite: NEGATIVE
Protein, ur: NEGATIVE mg/dL
Specific Gravity, Urine: 1.017 (ref 1.005–1.030)
pH: 6 (ref 5.0–8.0)

## 2023-11-24 LAB — PREGNANCY, URINE: Preg Test, Ur: NEGATIVE

## 2023-11-24 LAB — POCT PREGNANCY, URINE: Negative: NEGATIVE

## 2023-11-24 LAB — COMPREHENSIVE METABOLIC PANEL WITH GFR
ALT: 11 U/L (ref 0–44)
AST: 20 U/L (ref 15–41)
Albumin: 4.2 g/dL (ref 3.5–5.0)
Alkaline Phosphatase: 65 U/L (ref 38–126)
Anion gap: 9 (ref 5–15)
BUN: 11 mg/dL (ref 6–20)
CO2: 24 mmol/L (ref 22–32)
Calcium: 9.2 mg/dL (ref 8.9–10.3)
Chloride: 104 mmol/L (ref 98–111)
Creatinine, Ser: 0.76 mg/dL (ref 0.44–1.00)
GFR, Estimated: >60 mL/min (ref >60)
Glucose, Bld: 97 mg/dL (ref 70–99)
Potassium: 3.6 mmol/L (ref 3.5–5.1)
Sodium: 137 mmol/L (ref 135–145)
Total Bilirubin: 0.6 mg/dL (ref 0.0–1.2)
Total Protein: 7.2 g/dL (ref 6.5–8.1)

## 2023-11-24 LAB — CBC WITH DIFFERENTIAL/PLATELET
Abs Immature Granulocytes: 0.01 10*3/uL (ref 0.00–0.07)
Basophils Absolute: 0 10*3/uL (ref 0.0–0.1)
Basophils Relative: 0 %
Eosinophils Absolute: 0 10*3/uL (ref 0.0–0.5)
Eosinophils Relative: 1 %
HCT: 39.9 % (ref 36.0–46.0)
Hemoglobin: 13.8 g/dL (ref 12.0–15.0)
Immature Granulocytes: 0 %
Lymphocytes Relative: 35 %
Lymphs Abs: 2.5 10*3/uL (ref 0.7–4.0)
MCH: 30.4 pg (ref 26.0–34.0)
MCHC: 34.6 g/dL (ref 30.0–36.0)
MCV: 87.9 fL (ref 80.0–100.0)
Monocytes Absolute: 0.5 10*3/uL (ref 0.1–1.0)
Monocytes Relative: 7 %
Neutro Abs: 4.1 10*3/uL (ref 1.7–7.7)
Neutrophils Relative %: 57 %
Platelets: 233 10*3/uL (ref 150–400)
RBC: 4.54 MIL/uL (ref 3.87–5.11)
RDW: 11.2 % — ABNORMAL LOW (ref 11.5–15.5)
WBC: 7.1 10*3/uL (ref 4.0–10.5)
nRBC: 0 % (ref 0.0–0.2)

## 2023-11-24 MED ORDER — ACETAMINOPHEN 500 MG PO TABS
1000.0000 mg | ORAL_TABLET | Freq: Once | ORAL | Status: AC
  Administered 2023-11-24: 1000 mg via ORAL
  Filled 2023-11-24: qty 2

## 2023-11-24 MED ORDER — HYDROXYZINE HCL 10 MG PO TABS
10.0000 mg | ORAL_TABLET | Freq: Once | ORAL | Status: AC
  Administered 2023-11-24: 10 mg via ORAL
  Filled 2023-11-24 (×2): qty 1

## 2023-11-24 MED ORDER — LORAZEPAM 2 MG/ML IJ SOLN
1.0000 mg | Freq: Once | INTRAMUSCULAR | Status: AC
  Administered 2023-11-24: 1 mg via INTRAVENOUS
  Filled 2023-11-24: qty 1

## 2023-11-24 MED ORDER — KETOROLAC TROMETHAMINE 15 MG/ML IJ SOLN
15.0000 mg | Freq: Once | INTRAMUSCULAR | Status: AC
  Administered 2023-11-25: 15 mg via INTRAVENOUS
  Filled 2023-11-24: qty 1

## 2023-11-24 NOTE — ED Notes (Addendum)
 Per family another episode of seizure like activity lying on left side, tense and shaking lasting 10 minutes beginning at 2221.  Pt remains on side, tremors, eyes closed, alert to voice, unintelligible speech at this time. MD made aware. Ativan ordered and administered.

## 2023-11-24 NOTE — ED Notes (Signed)
 Pt noted to be sitting in bed talking to family in room and smiling, Aox4 at this time. This RN contacted  pharmacy to request Atarax per order.

## 2023-11-24 NOTE — ED Notes (Signed)
 This RN called to room by family for seizure like activity.  PT noted to be curle dup on left side, closed eyes and tremors. EDP Derrill Kay notified.

## 2023-11-24 NOTE — ED Provider Notes (Signed)
 [facility] Provider Note    Event Date/Time   First MD Initiated Contact with Patient 11/24/23 1935     (approximate)   History   Seizures   HPI  Deborah Mcdaniel is a 23 y.o. female who presents to the emergency department today because of concerns for seizures.  Patient has a history of FND seizures.  Has been seen by neurology.  States that she had multiple seizures today.  Is complaining of some headache and neck pain which she will get after these however today seems a little worse.  Additionally the patient had an episode of emesis today which she says has never happened with her seizures before. Denies any recent illness or fevers.      Physical Exam   Triage Vital Signs: ED Triage Vitals  Encounter Vitals Group     BP 11/24/23 1949 119/79     Systolic BP Percentile --      Diastolic BP Percentile --      Pulse Rate 11/24/23 1949 99     Resp 11/24/23 1949 17     Temp 11/24/23 1949 98 F (36.7 C)     Temp Source 11/24/23 1949 Oral     SpO2 11/24/23 1935 100 %     Weight 11/24/23 1951 115 lb (52.2 kg)     Height 11/24/23 1951 5' 1.25" (1.556 m)     Head Circumference --      Peak Flow --      Pain Score 11/24/23 1949 7     Pain Loc --      Pain Education --      Exclude from Growth Chart --     Most recent vital signs: Vitals:   11/24/23 1935 11/24/23 1949  BP:  119/79  Pulse:  99  Resp:  17  Temp:  98 F (36.7 C)  SpO2: 100% 100%   General: Awake, alert, oriented. CV:  Good peripheral perfusion. Regular rate and rhythm. Resp:  Normal effort. Lungs clear. Abd:  No distention.    ED Results / Procedures / Treatments   Labs (all labs ordered are listed, but only abnormal results are displayed) Labs Reviewed  CBC WITH DIFFERENTIAL/PLATELET - Abnormal; Notable for the following components:      Result Value   RDW 11.2 (*)    All other components within normal limits  URINALYSIS, ROUTINE W REFLEX MICROSCOPIC - Abnormal;  Notable for the following components:   Color, Urine YELLOW (*)    APPearance HAZY (*)    Ketones, ur 5 (*)    Leukocytes,Ua TRACE (*)    Bacteria, UA RARE (*)    All other components within normal limits  COMPREHENSIVE METABOLIC PANEL WITH GFR  PREGNANCY, URINE     EKG  I, Phineas Semen, attending physician, personally viewed and interpreted this EKG  EKG Time: 1943 Rate: 98 Rhythm: sinus rhythm Axis: normal Intervals: qtc 441 QRS: narrow, q waves v1, v2 ST changes: no st elevation Impression: abnormal ekg   RADIOLOGY None   PROCEDURES:  Critical Care performed: No    MEDICATIONS ORDERED IN ED: Medications - No data to display   IMPRESSION / MDM / ASSESSMENT AND PLAN / ED COURSE  I reviewed the triage vital signs and the nursing notes.                              Differential diagnosis includes, but is not  limited to, stress, FND  Patient's presentation is most consistent with acute presentation with potential threat to life or bodily function.  Patient presented to the urgency department today after multiple seizure-like episodes today.  Per chart review patient has been diagnosed with FND.  Last saw neurology a couple of months ago.  On initial exam patient is awake and alert.  Blood work was checked without any concerning abnormalities however while here in the emergency department patient had a couple more of these episodes.  Initially tried Atarax however patient had a another episode.  Will give Ativan.  I do anticipate when patient feels better she can be discharged home.      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    Phineas Semen, MD 11/24/23 2230

## 2023-11-24 NOTE — ED Triage Notes (Signed)
 Pt arrives from home via [facility], reports 2 seizures Thursday, 3 Friday, 7 today, plus 1 with EMS, all witnessed as "tensed up, shaking on side." Per witnesses, longest one 4 minutes and shortest a little over a minute. Per pt she has Functional Neurological D/O and has not gone more than 1 week without seizures since Nov. States aura prior to seizures "for the most part." States that she is not on seizure medications.  State sthat she is on Bystolic for her heart but has bot had in 4 days d/t insurance issues.  Pt states pain in neck and head at this time. Denies hitting head and witnesses agree.

## 2023-11-24 NOTE — ED Notes (Signed)
 Suction at bedside and seizure pads placed on bed.

## 2023-11-25 MED ORDER — LORAZEPAM 2 MG PO TABS
2.0000 mg | ORAL_TABLET | Freq: Once | ORAL | Status: AC
  Administered 2023-11-25: 2 mg via ORAL
  Filled 2023-11-25: qty 1

## 2023-11-25 NOTE — ED Provider Notes (Signed)
 Significant decrease in the seizure-like activity after medications given previously.  Responded well to Ativan.  Will give oral dose.  Patient and family are eager to go home at this time and know to follow-up with neurologist/psychiatrist as outpatient.  Offered admission/observation but feel symptoms have improved to the point they would like to go home.  Discharge.   Pilar Jarvis, MD 11/25/23 [phone]

## 2024-04-02 ENCOUNTER — Other Ambulatory Visit: Payer: Self-pay | Admitting: Allergy and Immunology
